# Patient Record
Sex: Male | Born: 1945 | Race: White | Hispanic: No | Marital: Married | Smoking: Never smoker
Health system: Southern US, Community
[De-identification: ages and names within clinical notes are randomized; demographics above are authoritative.]

## PROBLEM LIST (undated history)

## (undated) DIAGNOSIS — H7190 Unspecified cholesteatoma, unspecified ear: Secondary | ICD-10-CM

## (undated) DIAGNOSIS — J309 Allergic rhinitis, unspecified: Secondary | ICD-10-CM

## (undated) DIAGNOSIS — C4490 Unspecified malignant neoplasm of skin, unspecified: Secondary | ICD-10-CM

## (undated) DIAGNOSIS — R740 Nonspecific elevation of levels of transaminase and lactic acid dehydrogenase [LDH]: Secondary | ICD-10-CM

## (undated) DIAGNOSIS — L570 Actinic keratosis: Secondary | ICD-10-CM

## (undated) DIAGNOSIS — T7840XA Allergy, unspecified, initial encounter: Secondary | ICD-10-CM

## (undated) DIAGNOSIS — F419 Anxiety disorder, unspecified: Secondary | ICD-10-CM

## (undated) DIAGNOSIS — F329 Major depressive disorder, single episode, unspecified: Secondary | ICD-10-CM

## (undated) DIAGNOSIS — Z86010 Personal history of colonic polyps: Secondary | ICD-10-CM

## (undated) DIAGNOSIS — E785 Hyperlipidemia, unspecified: Secondary | ICD-10-CM

## (undated) DIAGNOSIS — R030 Elevated blood-pressure reading, without diagnosis of hypertension: Secondary | ICD-10-CM

## (undated) DIAGNOSIS — R569 Unspecified convulsions: Secondary | ICD-10-CM

## (undated) HISTORY — PX: TONSILLECTOMY: SUR1361

## (undated) HISTORY — DX: Unspecified convulsions: R56.9

## (undated) HISTORY — DX: Hyperlipidemia, unspecified: E78.5

## (undated) HISTORY — DX: Allergy, unspecified, initial encounter: T78.40XA

## (undated) HISTORY — PX: COLONOSCOPY: SHX174

## (undated) HISTORY — DX: Elevated blood-pressure reading, without diagnosis of hypertension: R03.0

## (undated) HISTORY — DX: Nonspecific elevation of levels of transaminase and lactic acid dehydrogenase (ldh): R74.0

## (undated) HISTORY — PX: APPENDECTOMY: SHX54

## (undated) HISTORY — PX: POLYPECTOMY: SHX149

## (undated) HISTORY — DX: Allergic rhinitis, unspecified: J30.9

## (undated) HISTORY — DX: Unspecified malignant neoplasm of skin, unspecified: C44.90

## (undated) HISTORY — PX: HERNIA REPAIR: SHX51

## (undated) HISTORY — PX: HEMORRHOID SURGERY: SHX153

## (undated) HISTORY — DX: Actinic keratosis: L57.0

## (undated) HISTORY — DX: Personal history of colonic polyps: Z86.010

## (undated) HISTORY — DX: Major depressive disorder, single episode, unspecified: F32.9

## (undated) HISTORY — DX: Unspecified cholesteatoma, unspecified ear: H71.90

## (undated) HISTORY — PX: LIPOMA EXCISION: SHX5283

---

## 2003-04-08 ENCOUNTER — Encounter: Payer: Self-pay | Admitting: Gastroenterology

## 2006-09-22 ENCOUNTER — Encounter: Payer: Self-pay | Admitting: Internal Medicine

## 2008-09-21 ENCOUNTER — Ambulatory Visit: Payer: Self-pay | Admitting: Internal Medicine

## 2008-09-21 DIAGNOSIS — R030 Elevated blood-pressure reading, without diagnosis of hypertension: Secondary | ICD-10-CM | POA: Insufficient documentation

## 2008-09-21 DIAGNOSIS — Z860100 Personal history of colon polyps, unspecified: Secondary | ICD-10-CM | POA: Insufficient documentation

## 2008-09-21 DIAGNOSIS — L570 Actinic keratosis: Secondary | ICD-10-CM

## 2008-09-21 DIAGNOSIS — E785 Hyperlipidemia, unspecified: Secondary | ICD-10-CM

## 2008-09-21 DIAGNOSIS — F3289 Other specified depressive episodes: Secondary | ICD-10-CM | POA: Insufficient documentation

## 2008-09-21 DIAGNOSIS — R569 Unspecified convulsions: Secondary | ICD-10-CM | POA: Insufficient documentation

## 2008-09-21 DIAGNOSIS — J309 Allergic rhinitis, unspecified: Secondary | ICD-10-CM | POA: Insufficient documentation

## 2008-09-21 DIAGNOSIS — F329 Major depressive disorder, single episode, unspecified: Secondary | ICD-10-CM

## 2008-09-21 HISTORY — DX: Hyperlipidemia, unspecified: E78.5

## 2008-09-21 HISTORY — DX: Allergic rhinitis, unspecified: J30.9

## 2008-09-21 HISTORY — DX: Actinic keratosis: L57.0

## 2008-09-21 HISTORY — DX: Personal history of colonic polyps: Z86.010

## 2008-09-21 HISTORY — DX: Elevated blood-pressure reading, without diagnosis of hypertension: R03.0

## 2008-09-21 HISTORY — DX: Other specified depressive episodes: F32.89

## 2008-09-21 HISTORY — DX: Personal history of colon polyps, unspecified: Z86.0100

## 2008-09-21 HISTORY — DX: Major depressive disorder, single episode, unspecified: F32.9

## 2008-09-21 HISTORY — DX: Unspecified convulsions: R56.9

## 2008-11-14 ENCOUNTER — Ambulatory Visit: Payer: Self-pay | Admitting: Internal Medicine

## 2008-12-28 ENCOUNTER — Ambulatory Visit: Payer: Self-pay | Admitting: Internal Medicine

## 2008-12-28 LAB — CONVERTED CEMR LAB
ALT: 35 units/L (ref 0–53)
AST: 37 units/L (ref 0–37)
Albumin: 4.1 g/dL (ref 3.5–5.2)
Alkaline Phosphatase: 73 units/L (ref 39–117)
Basophils Relative: 0 % (ref 0.0–3.0)
Blood in Urine, dipstick: NEGATIVE
CO2: 29 meq/L (ref 19–32)
Calcium: 8.8 mg/dL (ref 8.4–10.5)
Eosinophils Relative: 2.2 % (ref 0.0–5.0)
GFR calc non Af Amer: 91.22 mL/min (ref >60)
HDL: 53.1 mg/dL (ref >39.00)
Hemoglobin: 15.8 g/dL (ref 13.0–17.0)
Lymphocytes Relative: 30.3 % (ref 12.0–46.0)
MCHC: 34.6 g/dL (ref 30.0–36.0)
Monocytes Relative: 12.4 % — ABNORMAL HIGH (ref 3.0–12.0)
Neutro Abs: 2.5 10*3/uL (ref 1.4–7.7)
Nitrite: NEGATIVE
RBC: 4.83 M/uL (ref 4.22–5.81)
Sodium: 141 meq/L (ref 135–145)
Specific Gravity, Urine: 1.025
Total CHOL/HDL Ratio: 4
Total Protein: 7.1 g/dL (ref 6.0–8.3)
WBC Urine, dipstick: NEGATIVE
WBC: 4.6 10*3/uL (ref 4.5–10.5)

## 2009-01-05 ENCOUNTER — Ambulatory Visit: Payer: Self-pay | Admitting: Internal Medicine

## 2009-02-07 ENCOUNTER — Ambulatory Visit: Payer: Self-pay | Admitting: Internal Medicine

## 2009-02-07 DIAGNOSIS — H7190 Unspecified cholesteatoma, unspecified ear: Secondary | ICD-10-CM | POA: Insufficient documentation

## 2009-02-07 HISTORY — DX: Unspecified cholesteatoma, unspecified ear: H71.90

## 2009-03-09 ENCOUNTER — Ambulatory Visit: Payer: Self-pay | Admitting: Internal Medicine

## 2009-03-09 DIAGNOSIS — C4490 Unspecified malignant neoplasm of skin, unspecified: Secondary | ICD-10-CM

## 2009-03-09 HISTORY — DX: Unspecified malignant neoplasm of skin, unspecified: C44.90

## 2009-03-16 ENCOUNTER — Encounter (INDEPENDENT_AMBULATORY_CARE_PROVIDER_SITE_OTHER): Payer: Self-pay | Admitting: *Deleted

## 2009-04-04 ENCOUNTER — Telehealth: Payer: Self-pay | Admitting: Internal Medicine

## 2009-06-05 ENCOUNTER — Telehealth: Payer: Self-pay | Admitting: Internal Medicine

## 2009-06-27 ENCOUNTER — Ambulatory Visit: Payer: Self-pay | Admitting: Internal Medicine

## 2009-06-27 LAB — CONVERTED CEMR LAB
Alkaline Phosphatase: 85 units/L (ref 39–117)
Bilirubin, Direct: 0.1 mg/dL (ref 0.0–0.3)
LDL Cholesterol: 107 mg/dL — ABNORMAL HIGH (ref 0–99)
Total CHOL/HDL Ratio: 3
VLDL: 29.2 mg/dL (ref 0.0–40.0)

## 2009-07-05 ENCOUNTER — Ambulatory Visit: Payer: Self-pay | Admitting: Internal Medicine

## 2009-09-12 ENCOUNTER — Telehealth: Payer: Self-pay | Admitting: Internal Medicine

## 2010-02-23 ENCOUNTER — Ambulatory Visit: Payer: Self-pay | Admitting: Internal Medicine

## 2010-02-23 LAB — CONVERTED CEMR LAB
AST: 74 units/L — ABNORMAL HIGH (ref 0–37)
Albumin: 3.9 g/dL (ref 3.5–5.2)
Alkaline Phosphatase: 88 units/L (ref 39–117)
Basophils Absolute: 0 10*3/uL (ref 0.0–0.1)
Bilirubin Urine: NEGATIVE
Bilirubin, Direct: 0.1 mg/dL (ref 0.0–0.3)
Blood in Urine, dipstick: NEGATIVE
CO2: 30 meq/L (ref 19–32)
Calcium: 9 mg/dL (ref 8.4–10.5)
Cholesterol: 204 mg/dL — ABNORMAL HIGH (ref 0–200)
Creatinine, Ser: 1.1 mg/dL (ref 0.4–1.5)
Direct LDL: 115.2 mg/dL
Eosinophils Absolute: 0.1 10*3/uL (ref 0.0–0.7)
GFR calc non Af Amer: 73.63 mL/min (ref >60)
Glucose, Bld: 95 mg/dL (ref 70–99)
HDL: 56.6 mg/dL (ref >39.00)
Hemoglobin: 15.5 g/dL (ref 13.0–17.0)
Lymphocytes Relative: 20.9 % (ref 12.0–46.0)
MCHC: 34.3 g/dL (ref 30.0–36.0)
Monocytes Relative: 11.2 % (ref 3.0–12.0)
Neutro Abs: 4.4 10*3/uL (ref 1.4–7.7)
Neutrophils Relative %: 65.3 % (ref 43.0–77.0)
Nitrite: NEGATIVE
Platelets: 224 10*3/uL (ref 150.0–400.0)
RDW: 13.8 % (ref 11.5–14.6)
Sodium: 140 meq/L (ref 135–145)
Specific Gravity, Urine: 1.02
Total Bilirubin: 1.1 mg/dL (ref 0.3–1.2)
Triglycerides: 142 mg/dL (ref 0.0–149.0)
Urobilinogen, UA: 0.2

## 2010-03-02 ENCOUNTER — Ambulatory Visit: Payer: Self-pay | Admitting: Internal Medicine

## 2010-03-02 DIAGNOSIS — R740 Nonspecific elevation of levels of transaminase and lactic acid dehydrogenase [LDH]: Secondary | ICD-10-CM

## 2010-03-02 DIAGNOSIS — R7401 Elevation of levels of liver transaminase levels: Secondary | ICD-10-CM

## 2010-03-02 DIAGNOSIS — R7402 Elevation of levels of lactic acid dehydrogenase (LDH): Secondary | ICD-10-CM

## 2010-03-02 HISTORY — DX: Elevation of levels of lactic acid dehydrogenase (LDH): R74.02

## 2010-03-02 HISTORY — DX: Elevation of levels of liver transaminase levels: R74.01

## 2010-03-02 LAB — CONVERTED CEMR LAB
AST: 35 units/L (ref 0–37)
Albumin: 4.3 g/dL (ref 3.5–5.2)
Alkaline Phosphatase: 95 units/L (ref 39–117)
Bilirubin, Direct: 0.1 mg/dL (ref 0.0–0.3)
Indirect Bilirubin: 0.5 mg/dL (ref 0.0–0.9)
Total Bilirubin: 0.6 mg/dL (ref 0.3–1.2)

## 2010-03-05 ENCOUNTER — Telehealth: Payer: Self-pay | Admitting: Internal Medicine

## 2010-03-20 ENCOUNTER — Encounter (INDEPENDENT_AMBULATORY_CARE_PROVIDER_SITE_OTHER): Payer: Self-pay | Admitting: *Deleted

## 2010-04-03 ENCOUNTER — Encounter (INDEPENDENT_AMBULATORY_CARE_PROVIDER_SITE_OTHER): Payer: Self-pay | Admitting: *Deleted

## 2010-04-04 ENCOUNTER — Ambulatory Visit: Payer: Self-pay | Admitting: Gastroenterology

## 2010-04-16 ENCOUNTER — Ambulatory Visit: Payer: Self-pay | Admitting: Gastroenterology

## 2010-04-17 ENCOUNTER — Encounter: Payer: Self-pay | Admitting: Gastroenterology

## 2010-06-05 NOTE — Letter (Signed)
Summary: [name] Instructions  [facility]  [address] [place], [place] [address]   Phone: [phone]  Fax: [phone]       John [name]    Mar 13, 1946    MRN: [id number]        Procedure Day Dorna Bloom:  [name]  04/16/10     Arrival Time:  9:00am     Procedure Time: 10:00am     Location of Procedure:                    [name]  [facility] (4th Floor)                       PREPARATION FOR COLONOSCOPY WITH MOVIPREP   Starting 5 days prior to your procedure  [facility] 12/07  do not eat nuts, seeds, popcorn, corn, beans, peas,  salads, or any raw vegetables.  Do not take any fiber supplements (e.g. Metamucil, Citrucel, and Benefiber).  THE DAY BEFORE YOUR PROCEDURE         DATE:  SUNDAY  12/11  1.  Drink clear liquids the entire day-NO SOLID FOOD  2.  Do not drink anything colored red or purple.  Avoid juices with pulp.  No orange juice.  3.  Drink at least 64 oz. (8 glasses) of fluid/clear liquids during the day to prevent dehydration and help the prep work efficiently.  CLEAR LIQUIDS INCLUDE: Water Jello Ice Popsicles Tea (sugar ok, no milk/cream) Powdered fruit flavored drinks Coffee (sugar ok, no milk/cream) Gatorade Juice: apple, white grape, white cranberry  Lemonade Clear bullion, consomm, broth Carbonated beverages (any kind) Strained chicken noodle soup Hard Candy                             4.  In the morning, mix first dose of MoviPrep solution:    Empty 1 Pouch A and 1 Pouch B into the disposable container    Add lukewarm drinking water to the top line of the container. Mix to dissolve    Refrigerate (mixed solution should be used within 24 hrs)  5.  Begin drinking the prep at 5:00 p.m. The MoviPrep container is divided by 4 marks.   Every 15 minutes drink the solution down to the next mark (approximately 8 oz) until the full liter is complete.   6.  Follow completed prep with 16 oz of clear liquid of your choice  (Nothing red or purple).  Continue to drink clear liquids until bedtime.  7.  Before going to bed, mix second dose of MoviPrep solution:    Empty 1 Pouch A and 1 Pouch B into the disposable container    Add lukewarm drinking water to the top line of the container. Mix to dissolve    Refrigerate  THE DAY OF YOUR PROCEDURE      DATE: MONDAY  12/12  Beginning at  5:00 a.m. (5 hours before procedure):         1. Every 15 minutes, drink the solution down to the next mark (approx 8 oz) until the full liter is complete.  2. Follow completed prep with 16 oz. of clear liquid of your choice.    3. You may drink clear liquids until 8:00am  (2 HOURS BEFORE PROCEDURE).   MEDICATION INSTRUCTIONS  Unless otherwise instructed, you should take regular prescription medications with a small sip of water   as early as  possible the morning of your procedure.        OTHER INSTRUCTIONS  You will need a responsible adult at least 63 years of age to accompany you and drive you home.   This person must remain in the waiting room during your procedure.  Wear loose fitting clothing that is easily removed.  Leave jewelry and other valuables at home.  However, you may wish to bring a book to read or  an iPod/MP3 player to listen to music as you wait for your procedure to start.  Remove all body piercing jewelry and leave at home.  Total time from sign-in until discharge is approximately 2-3 hours.  You should go home directly after your procedure and rest.  You can resume normal activities the  day after your procedure.  The day of your procedure you should not:   Drive   Make legal decisions   Operate machinery   Drink alcohol   Return to work  You will receive specific instructions about eating, activities and medications before you leave.    The above instructions have been reviewed and explained to me by   Wyona Almas RN  April 04, 2010 8:24 AM     I fully understand  and can verbalize these instructions _____________________________ Date _________

## 2010-06-05 NOTE — Assessment & Plan Note (Signed)
Summary: 6 month follow up/cjr rsc bmp/njr   Vital Signs:  Patient profile:   65 year old male Height:      71 inches Weight:      165 pounds BMI:     23.10 Temp:     98.2 degrees F oral Pulse rate:   68 / minute Resp:     14 per minute BP sitting:   124 / 76  (left arm)  Vitals Entered By: Willy Eddy, LPN (July 05, 1608 9:06 AM) CC: roa labs   CC:  roa labs.  History of Present Illness:  Hyperlipidemia Follow-Up      This is a 65 year old man who presents for Hyperlipidemia follow-up.  The patient denies muscle aches, GI upset, abdominal pain, flushing, itching, constipation, diarrhea, and fatigue.  The patient denies the following symptoms: chest pain/pressure, exercise intolerance, dypsnea, palpitations, syncope, and pedal edema.  Compliance with medications (by patient report) has been near 100%.  Dietary compliance has been excellent.  The patient reports exercising 3-4X per week.  Adjunctive measures currently used by the patient include fish oil supplements.    Preventive Screening-Counseling & Management  Alcohol-Tobacco     Smoking Status: never  Problems Prior to Update: 1)  Carcinoma, Skin, Squamous Cell  (ICD-173.9) 2)  Cholesteatoma  (ICD-385.30) 3)  Physical Examination  (ICD-V70.0) 4)  Actinic Keratosis, Ear, Left  (ICD-702.0) 5)  Family History of Colon Ca 1st Degree Relative <60  (ICD-V16.0) 6)  Hx of Seizure Disorder  (ICD-780.39) 7)  Elevated Bp Reading Without Dx Hypertension  (ICD-796.2) 8)  Hyperlipidemia  (ICD-272.4) 9)  Depression  (ICD-311) 10)  Colonic Polyps, Hx of  (ICD-V12.72) 11)  Allergic Rhinitis  (ICD-477.9)  Current Problems (verified): 1)  Carcinoma, Skin, Squamous Cell  (ICD-173.9) 2)  Cholesteatoma  (ICD-385.30) 3)  Physical Examination  (ICD-V70.0) 4)  Actinic Keratosis, Ear, Left  (ICD-702.0) 5)  Family History of Colon Ca 1st Degree Relative <60  (ICD-V16.0) 6)  Hx of Seizure Disorder  (ICD-780.39) 7)  Elevated Bp  Reading Without Dx Hypertension  (ICD-796.2) 8)  Hyperlipidemia  (ICD-272.4) 9)  Depression  (ICD-311) 10)  Colonic Polyps, Hx of  (ICD-V12.72) 11)  Allergic Rhinitis  (ICD-477.9)  Medications Prior to Update: 1)  Prozac 20 Mg Caps (Fluoxetine Hcl) .Marland Kitchen.. 1 Once Daily 2)  Lipitor 40 Mg Tabs (Atorvastatin Calcium) .Marland Kitchen.. 1 Once Daily 3)  Multivitamins  Caps (Multiple Vitamin) .Marland Kitchen.. 1 Once Daily 4)  Fish Oil 1000 Mg Caps (Omega-3 Fatty Acids) .Marland Kitchen.. 1` Once Daily 5)  Bayer Aspirin Ec Low Dose 81 Mg Tbec (Aspirin) .Marland Kitchen.. 1 Once Daily 6)  Vitamin C 500 Mg Tabs (Ascorbic Acid) .Marland Kitchen.. 1 Once Daily 7)  Clonazepam 0.5 Mg Tbdp (Clonazepam) .... One By Mouth Two Times A Day Prn  Current Medications (verified): 1)  Prozac 20 Mg Caps (Fluoxetine Hcl) .Marland Kitchen.. 1 Once Daily 2)  Lipitor 40 Mg Tabs (Atorvastatin Calcium) .Marland Kitchen.. 1 Once Daily 3)  Multivitamins  Caps (Multiple Vitamin) .Marland Kitchen.. 1 Once Daily 4)  Fish Oil 1000 Mg Caps (Omega-3 Fatty Acids) .Marland Kitchen.. 1` Once Daily 5)  Bayer Aspirin Ec Low Dose 81 Mg Tbec (Aspirin) .Marland Kitchen.. 1 Once Daily 6)  Vitamin C 500 Mg Tabs (Ascorbic Acid) .Marland Kitchen.. 1 Once Daily 7)  Clonazepam 0.5 Mg Tbdp (Clonazepam) .Marland Kitchen.. 1 Q D As Needed  Allergies (verified): 1)  ! Penicillin V Potassium (Penicillin V Potassium)  Past History:  Family History: Last updated: 09/21/2008 Family History of  Colon CA 1st degree relative <60 Family History High cholesterol Family History of Stroke F 1st degree relative <60 Family History Hypertension  Social History: Last updated: 09/21/2008 Occupation:dean at [employer]  [phone]) Never Smoked   Alcohol use-yes  Drug use-no  Risk Factors: Smoking Status: never (07/05/2009)  Past medical, surgical, family and social histories (including risk factors) reviewed, and no changes noted (except as noted below).  Past Medical History: Reviewed history from 09/21/2008 and no changes required. Allergic rhinitis Colonic polyps, hx  of Depression Hyperlipidemia Hypertension Seizure disorder  IN HIGH SCHOOL TWO EPISODES/  [employer] WORK UP INCONCLUSIVE WITHOUT ANY RECURRENCE  Past Surgical History: Reviewed history from 09/21/2008 and no changes required. hernia repair 2009 appendectomy 2000 t&a 1955  Family History: Reviewed history from 09/21/2008 and no changes required. Family History of Colon CA 1st degree relative <60 Family History High cholesterol Family History of Stroke F 1st degree relative <60 Family History Hypertension  Social History: Reviewed history from 09/21/2008 and no changes required. 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  [phone]) Never Smoked   Alcohol use-yes  Drug use-no  Review of Systems  The patient denies anorexia, fever, weight loss, weight gain, vision loss, decreased hearing, hoarseness, chest pain, syncope, dyspnea on exertion, peripheral edema, prolonged cough, headaches, hemoptysis, abdominal pain, melena, hematochezia, severe indigestion/heartburn, hematuria, incontinence, genital sores, muscle weakness, suspicious skin lesions, transient blindness, difficulty walking, depression, unusual weight change, abnormal bleeding, enlarged lymph nodes, angioedema, and breast masses.    Physical Exam  General:  alert and well-hydrated.   Head:  normocephalic and atraumatic.   Eyes:  pupils equal and pupils round.   Ears:  .3 cm ulcer to the superioir aspect of the right pinna Nose:  no external deformity and no nasal discharge.   Lungs:  normal respiratory effort and no wheezes.   Heart:  normal rate and regular rhythm.   Abdomen:  soft and no splenomegaly.   Msk:  normal ROM and no crepitation.   Extremities:  No clubbing, cyanosis, edema, or deformity noted with normal full range of motion of all joints.   Neurologic:  No cranial nerve deficits noted. Station and gait are normal. Plantar reflexes are down-going bilaterally. DTRs are symmetrical throughout. Sensory, motor and  coordinative functions appear intact.   Impression & Recommendations:  Problem # 1:  HYPERLIPIDEMIA (ICD-272.4) Assessment Improved  His updated medication list for this problem includes:    Lipitor 40 Mg Tabs (Atorvastatin calcium) .Marland Kitchen... 1 once daily  Labs Reviewed: SGOT: 35 (06/27/2009)   SGPT: 35 (06/27/2009)  Prior 10 Yr Risk Heart Disease: Not enough information (09/21/2008)   HDL:62.00 (06/27/2009), 53.10 (12/28/2008)  LDL:107 (06/27/2009), 118 (12/28/2008)  Chol:198 (06/27/2009), 196 (12/28/2008)  Trig:146.0 (06/27/2009), 124.0 (12/28/2008)  Problem # 2:  DEPRESSION (ICD-311) Assessment: Unchanged  His updated medication list for this problem includes:    Prozac 20 Mg Caps (Fluoxetine hcl) .Marland Kitchen... 1 once daily    Clonazepam 0.5 Mg Tbdp (Clonazepam) .Marland Kitchen... 1 q d as needed  Discussed treatment options, including trial of antidpressant medication. Will refer to behavioral health. Follow-up call in in 24-48 hours and recheck in 2 weeks, sooner as needed. Patient agrees to call if any worsening of symptoms or thoughts of doing harm arise. Verified that the patient has no suicidal ideation at this time.   Problem # 3:  ALLERGIC RHINITIS (ICD-477.9)  Discussed use of allergy medications and environmental measures.   Complete Medication List: 1)  Prozac 20 Mg Caps (Fluoxetine  hcl) .... 1 once daily 2)  Lipitor 40 Mg Tabs (Atorvastatin calcium) .Marland Kitchen.. 1 once daily 3)  Multivitamins Caps (Multiple vitamin) .Marland Kitchen.. 1 once daily 4)  Fish Oil 1000 Mg Caps (Omega-3 fatty acids) .Marland Kitchen.. 1` once daily 5)  Bayer Aspirin Ec Low Dose 81 Mg Tbec (Aspirin) .Marland Kitchen.. 1 once daily 6)  Vitamin C 500 Mg Tabs (Ascorbic acid) .Marland Kitchen.. 1 once daily 7)  Clonazepam 0.5 Mg Tbdp (Clonazepam) .Marland Kitchen.. 1 q d as needed  Patient Instructions: 1)  Please schedule a follow-up appointment in 6 months.  CPX Prescriptions: CLONAZEPAM 0.5 MG TBDP (CLONAZEPAM) 1 q d as needed  #30 x 3   Entered by:   Willy Eddy, LPN    Authorized by:   Stacie Glaze MD   Signed by:   Willy Eddy, LPN on 13/12/6576   Method used:   Telephoned to ...       [facility] [phone]* (retail)       [address] [place], [place]  [address]       Ph: [phone]       Fax: [phone]   RxID:   [id number] PROZAC 20 MG CAPS (FLUOXETINE HCL) 1 once daily  #90 x 3   Entered by:   Willy Eddy, LPN   Authorized by:   Stacie Glaze MD   Signed by:   Willy Eddy, LPN on 32/95/1884   Method used:   Telephoned to ...       [facility] [phone]* (retail)       [address] [place], [place]  [address]       Ph: [phone]       Fax: [phone]   RxID:   [id number]

## 2010-06-05 NOTE — Progress Notes (Signed)
Summary: lab  Phone Note Call from Patient Call back at [phone]   Caller: vm Reason for Call: Lab or Test Results Summary of Call: Fri.  Paricularly t AST.   Initial call taken by: Rudy Jew, RN,  March 05, 2010 4:19 PM  Follow-up for Phone Call        liver functions were normal so isuspect it was a "proximal event" nothing serious at all Follow-up by: Stacie Glaze MD,  March 05, 2010 5:02 PM  Additional Follow-up for Phone Call Additional follow up Details #1::        pt aware Additional Follow-up by: Alfred Levins, CMA,  March 05, 2010 5:03 PM     Appended Document: Orders Update    Clinical Lists Changes  Orders: Added new Referral order of Gastroenterology Referral (GI) - Signed

## 2010-06-05 NOTE — Progress Notes (Signed)
Summary: sinus  Phone Note Call from Patient Call back at [phone]   Summary of Call: Cough for a couple days, fever of a degree, congestion, green mucus, headache, sinus pressure.  RA [place].  Allergic Pcn. Initial call taken by: Rudy Jew, RN,  Sep 12, 2009 3:16 PM  Follow-up for Phone Call        per dr Lovell Sheehan- zpack and atuss dm 2 tsp every 12 hours 6 oz Follow-up by: Willy Eddy, LPN,  Sep 12, 2009 3:57 PM  Additional Follow-up for Phone Call Additional follow up Details #1::        Phone Call Completed Additional Follow-up by: Rudy Jew, RN,  Sep 12, 2009 4:07 PM    New/Updated Medications: ATUSS DS 30-4-30 MG/5ML SUSP (PSEUDOEPHED HCL-CPM-DM HBR TAN) 2 teaspoons every 12 hours ZITHROMAX Z-PAK 250 MG TABS (AZITHROMYCIN) As directed Prescriptions: ZITHROMAX Z-PAK 250 MG TABS (AZITHROMYCIN) As directed  #1 x 0   Entered by:   Rudy Jew, RN   Authorized by:   Stacie Glaze MD   Signed by:   Rudy Jew, RN on 09/12/2009   Method used:   Electronically to        [facility]. [phone]* (retail)       [phone]       [place], [place]  [address]       Ph: [phone]       Fax: [phone]   RxID:   [id number] ATUSS DS 30-4-30 MG/5ML SUSP (PSEUDOEPHED HCL-CPM-DM HBR TAN) 2 teaspoons every 12 hours  #6 ounces x 0   Entered by:   Rudy Jew, RN   Authorized by:   Stacie Glaze MD   Signed by:   Rudy Jew, RN on 09/12/2009   Method used:   Electronically to        [facility]. [phone]* (retail)       [phone] [address]       [place], [place]  [address]       Ph: [phone]       Fax: [phone]   RxID:   [id number]

## 2010-06-05 NOTE — Assessment & Plan Note (Signed)
Summary: cpx/cjr/pt rsc from bmp/cjr-----PT RSC (BMP) // RS   Vital Signs:  Patient profile:   65 year old male Height:      71 inches Weight:      160 pounds BMI:     22.40 Temp:     98.2 degrees F oral Pulse rate:   68 / minute Resp:     14 per minute BP sitting:   126 / 80  (left arm)  Vitals Entered By: Willy Eddy, LPN (March 02, 2010 3:18 PM) CC: cpx Is Patient Diabetic? No   Primary Care Provider:  Stacie Glaze MD  CC:  cpx.  History of Present Illness: The pt was asked about all immunizations, health maint. services that are appropriate to their age and was given guidance on diet exercize  and weight management  elevated lfts seen in screening labs discussion of possible etiologies and development of plan as well as councilling I have spent greater that 30 min face to face evaluating this patient    Preventive Screening-Counseling & Management  Alcohol-Tobacco     Smoking Status: never     Tobacco Counseling: not indicated; no tobacco use  Problems Prior to Update: 1)  Transaminases, Serum, Elevated  (ICD-790.4) 2)  Carcinoma, Skin, Squamous Cell  (ICD-173.9) 3)  Cholesteatoma  (ICD-385.30) 4)  Physical Examination  (ICD-V70.0) 5)  Actinic Keratosis, Ear, Left  (ICD-702.0) 6)  Family History of Colon Ca 1st Degree Relative <60  (ICD-V16.0) 7)  Hx of Seizure Disorder  (ICD-780.39) 8)  Elevated Bp Reading Without Dx Hypertension  (ICD-796.2) 9)  Hyperlipidemia  (ICD-272.4) 10)  Depression  (ICD-311) 11)  Colonic Polyps, Hx of  (ICD-V12.72) 12)  Allergic Rhinitis  (ICD-477.9)  Current Problems (verified): 1)  Carcinoma, Skin, Squamous Cell  (ICD-173.9) 2)  Cholesteatoma  (ICD-385.30) 3)  Physical Examination  (ICD-V70.0) 4)  Actinic Keratosis, Ear, Left  (ICD-702.0) 5)  Family History of Colon Ca 1st Degree Relative <60  (ICD-V16.0) 6)  Hx of Seizure Disorder  (ICD-780.39) 7)  Elevated Bp Reading Without Dx Hypertension  (ICD-796.2) 8)   Hyperlipidemia  (ICD-272.4) 9)  Depression  (ICD-311) 10)  Colonic Polyps, Hx of  (ICD-V12.72) 11)  Allergic Rhinitis  (ICD-477.9)  Medications Prior to Update: 1)  Prozac 20 Mg Caps (Fluoxetine Hcl) .Marland Kitchen.. 1 Once Daily 2)  Lipitor 40 Mg Tabs (Atorvastatin Calcium) .Marland Kitchen.. 1 Once Daily 3)  Multivitamins  Caps (Multiple Vitamin) .Marland Kitchen.. 1 Once Daily 4)  Fish Oil 1000 Mg Caps (Omega-3 Fatty Acids) .Marland Kitchen.. 1` Once Daily 5)  Bayer Aspirin Ec Low Dose 81 Mg Tbec (Aspirin) .Marland Kitchen.. 1 Once Daily 6)  Vitamin C 500 Mg Tabs (Ascorbic Acid) .Marland Kitchen.. 1 Once Daily 7)  Clonazepam 0.5 Mg Tbdp (Clonazepam) .Marland Kitchen.. 1 Q D As Needed 8)  Atuss Ds 30-4-30 Mg/70ml Susp (Pseudoephed Hcl-Cpm-Dm Hbr Tan) .... 2 Teaspoons Every 12 Hours 9)  Zithromax Z-Pak 250 Mg Tabs (Azithromycin) .... As Directed  Current Medications (verified): 1)  Prozac 20 Mg Caps (Fluoxetine Hcl) .Marland Kitchen.. 1 Once Daily 2)  Lipitor 40 Mg Tabs (Atorvastatin Calcium) .Marland Kitchen.. 1 Once Daily 3)  Multivitamins  Caps (Multiple Vitamin) .Marland Kitchen.. 1 Once Daily 4)  Fish Oil 1000 Mg Caps (Omega-3 Fatty Acids) .Marland Kitchen.. 1` Once Daily 5)  Bayer Aspirin Ec Low Dose 81 Mg Tbec (Aspirin) .Marland Kitchen.. 1 Once Daily 6)  Vitamin C 500 Mg Tabs (Ascorbic Acid) .Marland Kitchen.. 1 Once Daily 7)  Clonazepam 0.5 Mg Tbdp (Clonazepam) .Marland Kitchen.. 1 Q D As Needed  Allergies (verified): 1)  ! Penicillin V Potassium (Penicillin V Potassium)  Past History:  Family History: Last updated: 09/21/2008 Family History of Colon CA 1st degree relative <60 Family History High cholesterol Family History of Stroke F 1st degree relative <60 Family History Hypertension  Social History: Last updated: 09/21/2008 Occupation:dean at [employer]  [phone]) Never Smoked   Alcohol use-yes  Drug use-no  Risk Factors: Smoking Status: never (03/02/2010)  Past medical, surgical, family and social histories (including risk factors) reviewed, and no changes noted (except as noted below).  Past Medical History: Reviewed history from  09/21/2008 and no changes required. Allergic rhinitis Colonic polyps, hx of Depression Hyperlipidemia Hypertension Seizure disorder  IN HIGH SCHOOL TWO EPISODES/  [employer] WORK UP INCONCLUSIVE WITHOUT ANY RECURRENCE  Past Surgical History: Reviewed history from 09/21/2008 and no changes required. hernia repair 2009 appendectomy 2000 t&a 1955  Family History: Reviewed history from 09/21/2008 and no changes required. Family History of Colon CA 1st degree relative <60 Family History High cholesterol Family History of Stroke F 1st degree relative <60 Family History Hypertension  Social History: Reviewed history from 09/21/2008 and no changes required. 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  [phone]) Never Smoked   Alcohol use-yes  Drug use-no  Review of Systems       Flu Vaccine Consent Questions     Do you have a history of severe allergic reactions to this vaccine? no    Any prior history of allergic reactions to egg and/or gelatin? no    Do you have a sensitivity to the preservative Thimersol? no    Do you have a past history of Guillan-Barre Syndrome? no    Do you currently have an acute febrile illness? no    Have you ever had a severe reaction to latex? no    Vaccine information given and explained to patient? yes    Are you currently pregnant? no    Lot Number:[id number]   Exp Date:11/03/2010   Site Given  Left Deltoid IM   Physical Exam  General:  alert and well-hydrated.   Head:  normocephalic and atraumatic.   Eyes:  pupils equal and pupils round.   Ears:  R ear normal and L ear normal.   Nose:  no external deformity and no nasal discharge.   Chest Wall:  No deformities, masses, tenderness or gynecomastia noted. Lungs:  normal respiratory effort and no wheezes.   Heart:  normal rate and regular rhythm.   Abdomen:  soft and no splenomegaly.   Rectal:  no external abnormalities and no hemorrhoids.   Genitalia:  circumcised and no urethral discharge.     Prostate:  no gland enlargement and no nodules.   Msk:  normal ROM and no crepitation.   Pulses:  R and L carotid,radial,femoral,dorsalis pedis and posterior tibial pulses are full and equal bilaterally Extremities:  No clubbing, cyanosis, edema, or deformity noted with normal full range of motion of all joints.   Neurologic:  No cranial nerve deficits noted. Station and gait are normal. Plantar reflexes are down-going bilaterally. DTRs are symmetrical throughout. Sensory, motor and coordinative functions appear intact.   Impression & Recommendations:  Problem # 1:  TRANSAMINASES, SERUM, ELEVATED (ICD-790.4) Assessment New revieww all couses with pt ands agreed to recheck to see if transiet elevation may have been due to etoh Orders: Venipuncture (97673) TLB-Hepatic/Liver Function Pnl (80076-HEPATIC) Specimen Handling (41937)  Problem # 2:  PHYSICAL EXAMINATION (ICD-V70.0) The pt was asked about all immunizations, health maint.  services that are appropriate to their age and was given guidance on diet exercize  and weight management  Colonoscopy: normal (11/03/2006) Td Booster: Tdap (01/05/2009)   Flu Vax: Fluvax 3+ (03/02/2010)   Pneumovax: Pneumovax (03/02/2010) Chol: 204 (02/23/2010)   HDL: 56.60 (02/23/2010)   LDL: 107 (06/27/2009)   TG: 142.0 (02/23/2010) TSH: 2.23 (02/23/2010)   PSA: 0.53 (02/23/2010) Next Colonoscopy due:: 11/2009 (02/07/2009)  Discussed using sunscreen, use of alcohol, drug use, self testicular exam, routine dental care, routine eye care, routine physical exam, seat belts, multiple vitamins, osteoporosis prevention, adequate calcium intake in diet, and recommendations for immunizations.  Discussed exercise and checking cholesterol.  Discussed gun safety, safe sex, and contraception. Also recommend checking PSA.  Complete Medication List: 1)  Prozac 20 Mg Caps (Fluoxetine hcl) .Marland Kitchen.. 1 once daily 2)  Lipitor 40 Mg Tabs (Atorvastatin calcium) .Marland Kitchen.. 1 once daily 3)   Multivitamins Caps (Multiple vitamin) .Marland Kitchen.. 1 once daily 4)  Fish Oil 1000 Mg Caps (Omega-3 fatty acids) .Marland Kitchen.. 1` once daily 5)  Bayer Aspirin Ec Low Dose 81 Mg Tbec (Aspirin) .Marland Kitchen.. 1 once daily 6)  Vitamin C 500 Mg Tabs (Ascorbic acid) .Marland Kitchen.. 1 once daily 7)  Clonazepam 0.5 Mg Tbdp (Clonazepam) .Marland Kitchen.. 1 q d as needed  Other Orders: Admin 1st Vaccine (81191) Flu Vaccine 50yrs + (47829) Pneumococcal Vaccine (56213) Admin of Any Addtl Vaccine (08657)  Patient Instructions: 1)  Please schedule a follow-up appointment in 6 months. 2)  Hepatic Panel prior to visit, ICD-9:995.20 3)  Lipid Panel prior to visit, ICD-9:272.4 Prescriptions: CLONAZEPAM 0.5 MG TBDP (CLONAZEPAM) 1 q d as needed  #90 x 1   Entered by:   Willy Eddy, LPN   Authorized by:   Stacie Glaze MD   Signed by:   Willy Eddy, LPN on 84/69/6295   Method used:   Print then Give to Patient   RxID:   [id number] LIPITOR 40 MG TABS (ATORVASTATIN CALCIUM) 1 once daily  #90 x 3   Entered by:   Willy Eddy, LPN   Authorized by:   Stacie Glaze MD   Signed by:   Willy Eddy, LPN on 66/44/0347   Method used:   Print then Give to Patient   RxID:   [id number] PROZAC 20 MG CAPS (FLUOXETINE HCL) 1 once daily  #90 x 3   Entered by:   Willy Eddy, LPN   Authorized by:   Stacie Glaze MD   Signed by:   Willy Eddy, LPN on 51/88/4166   Method used:   Print then Give to Patient   RxID:   [id number]    Orders Added: 1)  Admin 1st Vaccine [90471] 2)  Flu Vaccine 12yrs + [32202] 3)  Venipuncture [54270] 4)  TLB-Hepatic/Liver Function Pnl [80076-HEPATIC] 5)  Specimen Handling [99000] 6)  Pneumococcal Vaccine [62376] 7)  Admin of Any Addtl Vaccine [90472] 8)  Est. Patient 40-64 years [99396] 9)  Est. Patient Level III [28315]   Immunizations Administered:  Pneumonia Vaccine:    Vaccine Type: Pneumovax    Site: right deltoid    Mfr: Merck    Dose: 0.5 ml    Route: IM    Given  by: Willy Eddy, LPN    Exp. Date: 08/30/2011    Lot #: [id number]   Immunizations Administered:  Pneumonia Vaccine:    Vaccine Type: Pneumovax    Site: right deltoid    Mfr: Merck    Dose:  0.5 ml    Route: IM    Given by: Willy Eddy, LPN    Exp. Date: 08/30/2011    Lot #: [id number]

## 2010-06-05 NOTE — Miscellaneous (Signed)
Summary: [facility] Previsit/prep  Clinical Lists Changes  Medications: Added new medication of MOVIPREP 100 GM  SOLR (PEG-KCL-NACL-NASULF-NA ASC-C) As per prep instructions. - Signed Rx of MOVIPREP 100 GM  SOLR (PEG-KCL-NACL-NASULF-NA ASC-C) As per prep instructions.;  #1 x 0;  Signed;  Entered by: Wyona Almas RN;  Authorized by: Meryl Dare MD [facility];  Method used: Electronically to [facility] [id number]*, [address] [place], [place], [place]  [address], Ph: [phone], Fax: [phone] Allergies: Changed allergy or adverse reaction from PENICILLIN V POTASSIUM (PENICILLIN V POTASSIUM) to PENICILLIN V POTASSIUM (PENICILLIN V POTASSIUM) Observations: Added new observation of ALLERGY REV: Done (04/04/2010 7:58)    Prescriptions: MOVIPREP 100 GM  SOLR (PEG-KCL-NACL-NASULF-NA ASC-C) As per prep instructions.  #1 x 0   Entered by:   Wyona Almas RN   Authorized by:   Meryl Dare MD [facility]   Signed by:   Wyona Almas RN on 04/04/2010   Method used:   Electronically to        [facility] [id number]* (retail)       [address] [place], [place]  [address]       Ph: [phone]       Fax: [phone]   RxID:   [id number]

## 2010-06-05 NOTE — Letter (Signed)
Summary: Records from [facility] 2002 - 2008  Records from [facility] 2002 - 2008   Imported By: Maryln Gottron 10/19/2009 12:25:36  _____________________________________________________________________  External Attachment:    Type:   Image     Comment:   External Document

## 2010-06-05 NOTE — Progress Notes (Signed)
Summary: Pt req refill of generic Prozac 20mg  to [facility]  Phone Note Call from Patient Call back at [phone] cell   Caller: Patient Summary of Call: Pt called to req refill of generic Prozac 20mg .   This med was prescribed by pts previous doctor. Please call in to [facility] on [address].     Initial call taken by: Lucy Antigua,  June 05, 2009 8:13 AM    Prescriptions: PROZAC 20 MG CAPS (FLUOXETINE HCL) 1 once daily  #30 x 6   Entered by:   Willy Eddy, LPN   Authorized by:   Stacie Glaze MD   Signed by:   Willy Eddy, LPN on 24/40/1027   Method used:   Electronically to        [facility] [phone]* (retail)       [address] [place], [place]  [address]       Ph: [phone]       Fax: [phone]   RxID:   [id number] PROZAC 20 MG CAPS (FLUOXETINE HCL) 1 once daily  #30 x 6   Entered by:   Willy Eddy, LPN   Authorized by:   Stacie Glaze MD   Signed by:   Willy Eddy, LPN on 93/23/5573   Method used:   Print then Give to Patient   RxID:   [id number]

## 2010-06-05 NOTE — Letter (Signed)
Summary: Pre Visit Letter Revised  [facility]  [address] [place], [place] [address]   Phone: [phone]  Fax: [phone]        03/20/2010 MRN: [id number]   John [name] [address] [place], [place]  [address]             Procedure Date:  April 16, 2010   Welcome to the Gastroenterology Division at [facility].    You are scheduled to see a nurse for your pre-procedure visit on NOVEMBER 30, 2011at 8:00 A.M. on the 3rd floor at [facility], [address]  We ask that you try to arrive at our office 15 minutes prior to your appointment time to allow for check-in.  Please take a minute to review the attached form.  If you answer "Yes" to one or more of the questions on the first page, we ask that you call the person listed at your earliest opportunity.  If you answer "No" to all of the questions, please complete the rest of the form and bring it to your appointment.    Your nurse visit will consist of discussing your medical and surgical history, your immediate family medical history, and your medications.   If you are unable to list all of your medications on the form, please bring the medication bottles to your appointment and we will list them.  We will need to be aware of both prescribed and over the counter drugs.  We will need to know exact dosage information as well.    Please be prepared to read and sign documents such as consent forms, a financial agreement, and acknowledgement forms.  If necessary, and with your consent, a friend or relative is welcome to sit-in on the nurse visit with you.  Please bring your insurance card so that we may make a copy of it.  If your insurance requires a referral to see a specialist, please bring your referral form from your primary care physician.  No co-pay is required for this nurse visit.     If you cannot keep your appointment, please call [phone] to cancel or reschedule prior to your  appointment date.  This allows [place] the opportunity to schedule an appointment for another patient in need of care.    Thank you for choosing [facility] for your medical needs.  We appreciate the opportunity to care for you.  Please visit [place] at our website  to learn more about our practice.  Sincerely, The Gastroenterology Division

## 2010-06-07 NOTE — Letter (Signed)
Summary: Patient Notice- Polyp Results  [facility]  [address] [place], [place] [address]   Phone: [phone]  Fax: [phone]        April 17, 2010 MRN: [id number]    John [name] [address] [place], [place]  [address]    Dear [name],  I am pleased to inform you that the colon polyp(s) removed during your recent colonoscopy was (were) found to be benign (no cancer detected) upon pathologic examination.  I recommend you have a repeat colonoscopy examination in 5 years to look for recurrent polyps, as having colon polyps increases your risk for having recurrent polyps or even colon cancer in the future.  Should you develop new or worsening symptoms of abdominal pain, bowel habit changes or bleeding from the rectum or bowels, please schedule an evaluation with either your primary care physician or with me.  Continue treatment plan as outlined the day of your exam.  Please call us if you are having persistent problems or have questions about your condition that have not been fully answered at this time.  Sincerely,  Meryl Dare MD [facility]  This letter has been electronically signed by your physician.  Appended Document: Patient Notice- Polyp Results Letter Mailed

## 2010-06-07 NOTE — Procedures (Signed)
Summary: Colonoscopy  Patient: [name] Note: All result statuses are Final unless otherwise noted.  Tests: (1) Colonoscopy (COL)   COL Colonoscopy           DONE     [facility]     [address]     [place], [place]  [address]           COLONOSCOPY PROCEDURE REPORT     PATIENT:  John [name], John [name]  MR#:  [id number]     BIRTHDATE:  [birth year], 64 yrs. old  GENDER:  male     ENDOSCOPIST:  Judie Petit T. Russella Dar, MD, [facility]     Referred by:  Stacie Glaze, M.D.     PROCEDURE DATE:  04/16/2010     PROCEDURE:  Colonoscopy with snare polypectomy     ASA CLASS:  Class II     INDICATIONS:  1) surveillance and high-risk screening  2) history     of adenomatous colon polyps     MEDICATIONS:   Fentanyl 100 mcg IV, Versed 10 mg IV     DESCRIPTION OF PROCEDURE:   After the risks benefits and     alternatives of the procedure were thoroughly explained, informed     consent was obtained.  Digital rectal exam was performed and     revealed no abnormalities.   The LB [id number] endoscope     was introduced through the anus and advanced to the cecum, which     was identified by both the appendix and ileocecal valve, without     limitations.  The quality of the prep was excellent, using     MoviPrep.  The instrument was then slowly withdrawn as the colon     was fully examined.     <<PROCEDUREIMAGES>>     FINDINGS:  Two polyps were found in the descending colon. They     were 4 - 5 mm in size. Polyps were snared without cautery.     Retrieval was successful. A normal appearing cecum, ileocecal     valve, and appendiceal orifice were identified. The ascending,     hepatic flexure, transverse, splenic flexure, sigmoid colon, and     rectum appeared unremarkable.  Retroflexed views in the rectum     revealed no abnormalities.  The time to cecum =  3.5  minutes. The     scope was then withdrawn (time =  12.5  min) from the patient and     the procedure completed.        COMPLICATIONS:  None           ENDOSCOPIC IMPRESSION:     1) 4 - 5 mm, two polyps in the descending colon           RECOMMENDATIONS:     1) Await pathology results     2) Repeat Colonoscopy in 5 years pending pathology review           Malcolm T. Russella Dar, MD, [facility]           n.     eSIGNED:   Venita Lick. Stark at 04/16/2010 09:57 AM           Victorino Sparrow, [id number]  Note: An exclamation mark (!) indicates a result that was not dispersed into the flowsheet. Document Creation Date: 04/16/2010 9:57 AM _______________________________________________________________________  (1) Order result status: Final Collection or observation date-time: 04/16/2010 09:51 Requested date-time:  Receipt date-time:  Reported date-time:  Referring Physician:  Ordering Physician: Claudette Head [phone]) Specimen Source:  Source: Launa Grill Order Number: [id number] Lab site:   Appended Document: Colonoscopy     Appended Document: Colonoscopy     Procedures Next Due Date:    Colonoscopy: 04/2015

## 2010-06-07 NOTE — Procedures (Signed)
Summary: [facility]  [facility]   Imported By: Lester Ogdensburg 04/16/2010 08:48:02  _____________________________________________________________________  External Attachment:    Type:   Image     Comment:   External Document

## 2010-06-12 ENCOUNTER — Telehealth: Payer: Self-pay | Admitting: *Deleted

## 2010-06-12 NOTE — Telephone Encounter (Signed)
Is having a sore throat and leaving for [place] in 2 days........has green mucus, but only wants an antibiotic if bacterial.  No fever.

## 2010-06-13 ENCOUNTER — Ambulatory Visit (INDEPENDENT_AMBULATORY_CARE_PROVIDER_SITE_OTHER): Payer: BC Managed Care – PPO | Admitting: Internal Medicine

## 2010-06-13 ENCOUNTER — Encounter: Payer: Self-pay | Admitting: Internal Medicine

## 2010-06-13 VITALS — BP 124/80 | HR 68 | Temp 98.6°F | Resp 14 | Ht 71.0 in | Wt 160.0 lb

## 2010-06-13 DIAGNOSIS — E785 Hyperlipidemia, unspecified: Secondary | ICD-10-CM

## 2010-06-13 DIAGNOSIS — J019 Acute sinusitis, unspecified: Secondary | ICD-10-CM

## 2010-06-13 MED ORDER — AZITHROMYCIN 250 MG PO TABS: 250.0000 mg | ORAL_TABLET | Freq: Every day | ORAL | Status: AC

## 2010-06-13 MED ORDER — PHENYLEPHRINE-APAP-GUAIFENESIN 10-650-400 MG/20ML PO LIQD: 5.0000 mL | Freq: Four times a day (QID) | ORAL | Status: DC | PRN

## 2010-06-13 NOTE — Telephone Encounter (Signed)
Talked with pt and he ov at 12 noon

## 2010-06-13 NOTE — Progress Notes (Signed)
  Subjective:    Patient ID: John [name], male    DOB: 05-14-1945, 65 y.o.   MRN: [id number]  Sinusitis This is a new problem. The current episode started in the past 7 days. The problem has been gradually worsening since onset. There has been no fever. His pain is at a severity of 2/10. Associated symptoms include chills, congestion, coughing, headaches and sinus pressure. Past treatments include oral decongestants. The treatment provided mild relief.      Review of Systems  Constitutional: Positive for chills.  HENT: Positive for congestion and sinus pressure.   Respiratory: Positive for cough.   Neurological: Positive for headaches.       Objective:   Physical Exam  Constitutional: He appears well-developed and well-nourished.  HENT:  Head: Normocephalic and atraumatic.        Nasal passages showed swollen turbinates with crusty discharge oropharynx showed erythema and posterior cobblestoning neck was supple without adenopathy  Eyes: Conjunctivae are normal. Pupils are equal, round, and reactive to light.  Neck: Normal range of motion. Neck supple.  Cardiovascular: Normal rate and regular rhythm.   Pulmonary/Chest: Effort normal and breath sounds normal.  Abdominal: Soft. Bowel sounds are normal.  Musculoskeletal: Normal range of motion.  Skin: Skin is warm and dry.          Assessment & Plan:  1. Acute sinusitis the patient has a history of allergic reaction to penicillin therefore azithromycin will be chosen for his infection he is also instructed to get around Mucinex fast max cough and cold use it 4 times a day and use saline lavage 4 times a day.  He has a flight plan and was instructed if he develops ear pain prior to his flight he should contact our office and not fly he should use a saline nasal spray prior to flight to irrigate sinus passages for safety.  Should his symptoms worsen he is instructed to contact us for . Referral to their nose and throat

## 2010-06-13 NOTE — Patient Instructions (Signed)
Obtain a saline nasal spray and irrigate the sinuses 3-4 times a day as needed especially use this prior to your flight

## 2010-06-13 NOTE — Telephone Encounter (Signed)
Add on this AM at end of clinic

## 2010-07-24 ENCOUNTER — Telehealth: Payer: Self-pay | Admitting: Internal Medicine

## 2010-07-24 NOTE — Telephone Encounter (Signed)
Pt called and has had diarrhrea, fever,bloating, headache, for approx 1 week. Pt is req med called in to [facility]

## 2010-07-24 NOTE — Telephone Encounter (Signed)
Left message on machine for pt about virus going around- unable to contact him on cell phone--no dial tone--gave insturctions if virus but if continues over 1 week to 2 weeks give [place] a call back-stay hydrated

## 2010-07-25 ENCOUNTER — Other Ambulatory Visit: Payer: Self-pay | Admitting: Internal Medicine

## 2010-09-24 ENCOUNTER — Other Ambulatory Visit: Payer: Self-pay | Admitting: Internal Medicine

## 2010-09-24 ENCOUNTER — Other Ambulatory Visit (INDEPENDENT_AMBULATORY_CARE_PROVIDER_SITE_OTHER): Payer: BC Managed Care – PPO | Admitting: Internal Medicine

## 2010-09-24 ENCOUNTER — Other Ambulatory Visit (INDEPENDENT_AMBULATORY_CARE_PROVIDER_SITE_OTHER): Payer: BC Managed Care – PPO

## 2010-09-24 DIAGNOSIS — E785 Hyperlipidemia, unspecified: Secondary | ICD-10-CM

## 2010-09-24 DIAGNOSIS — T887XXA Unspecified adverse effect of drug or medicament, initial encounter: Secondary | ICD-10-CM

## 2010-09-24 DIAGNOSIS — Z13220 Encounter for screening for lipoid disorders: Secondary | ICD-10-CM

## 2010-09-24 LAB — HEPATIC FUNCTION PANEL
ALT: 39 U/L (ref 0–53)
Albumin: 3.7 g/dL (ref 3.5–5.2)
Bilirubin, Direct: 0.1 mg/dL (ref 0.0–0.3)
Total Protein: 6.1 g/dL (ref 6.0–8.3)

## 2010-09-24 LAB — LDL CHOLESTEROL, DIRECT: Direct LDL: 107.2 mg/dL

## 2010-09-24 LAB — LIPID PANEL: Triglycerides: 336 mg/dL — ABNORMAL HIGH (ref 0.0–149.0)

## 2010-10-04 ENCOUNTER — Ambulatory Visit (INDEPENDENT_AMBULATORY_CARE_PROVIDER_SITE_OTHER): Payer: BC Managed Care – PPO | Admitting: Internal Medicine

## 2010-10-04 ENCOUNTER — Encounter: Payer: Self-pay | Admitting: Internal Medicine

## 2010-10-04 VITALS — BP 132/80 | HR 68 | Temp 98.0°F | Resp 14 | Ht 71.0 in | Wt 163.0 lb

## 2010-10-04 DIAGNOSIS — R7401 Elevation of levels of liver transaminase levels: Secondary | ICD-10-CM

## 2010-10-04 DIAGNOSIS — E785 Hyperlipidemia, unspecified: Secondary | ICD-10-CM

## 2010-10-04 DIAGNOSIS — F339 Major depressive disorder, recurrent, unspecified: Secondary | ICD-10-CM

## 2010-10-04 MED ORDER — DULOXETINE HCL 60 MG PO CPEP: 60.0000 mg | ORAL_CAPSULE | Freq: Every day | ORAL | Status: DC

## 2010-10-04 NOTE — Progress Notes (Signed)
  Subjective:    Patient ID: John [name], male    DOB: 1945/05/27, 65 y.o.   MRN: [id number]  HPI patient presents for followup of hyperlipidemia and we reviewed his lab work showing fairly stable cholesterol but a markedly elevated triglyceride a slight elevation of his liver functions this causes there is a discussion about use of alcohol he admits that he is using alcohol frequently and we discussed whether or not this was due to an addiction or do to a desire to treat symptoms.  We concluded our conversation that he was treating depression by increasing alcohol he's been on Prozac for many years on 20 mg without any titration up or down I suspect that he is experiencing situational depression due to work and home stresses.    Review of Systems  Constitutional: Negative for fever and fatigue.  HENT: Negative for hearing loss, congestion, neck pain and postnasal drip.   Eyes: Negative for discharge, redness and visual disturbance.  Respiratory: Negative for cough, shortness of breath and wheezing.   Cardiovascular: Negative for leg swelling.  Gastrointestinal: Negative for abdominal pain, constipation and abdominal distention.  Genitourinary: Negative for urgency and frequency.  Musculoskeletal: Negative for joint swelling and arthralgias.  Skin: Negative for color change and rash.  Neurological: Negative for weakness and light-headedness.  Hematological: Negative for adenopathy.  Psychiatric/Behavioral: Positive for dysphoric mood. Negative for behavioral problems.       Past Medical History  Diagnosis Date  . ACTINIC KERATOSIS, EAR, LEFT 09/21/2008  . ALLERGIC RHINITIS 09/21/2008  . 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 CHOLESTEATOMA 02/07/2009  . COLONIC POLYPS, HX OF 09/21/2008  . DEPRESSION 09/21/2008  . ELEVATED BP READING WITHOUT DX HYPERTENSION 09/21/2008  . HYPERLIPIDEMIA 09/21/2008  . SEIZURE DISORDER 09/21/2008  . TRANSAMINASES, SERUM, ELEVATED 03/02/2010   Past  Surgical History  Procedure Date  . Hernia repair   . Appendectomy   . Tonsillectomy     reports that he has never smoked. He does not have any smokeless tobacco history on file. He reports that he drinks alcohol. He reports that he does not use illicit drugs. family history includes Cancer in an unspecified family member; Colon cancer in an unspecified family member; Hyperlipidemia in an unspecified family member; Hypertension in an unspecified family member; and Stroke in an unspecified family member. Allergies  Allergen Reactions  . Penicillins     REACTION: rash    Objective:   Physical Exam  Constitutional: He appears well-developed and well-nourished.  HENT:  Head: Normocephalic and atraumatic.  Eyes: Conjunctivae are normal. Pupils are equal, round, and reactive to light.  Neck: Normal range of motion. Neck supple.  Cardiovascular: Normal rate and regular rhythm.   Pulmonary/Chest: Effort normal and breath sounds normal.  Abdominal: Soft. Bowel sounds are normal.          Assessment & Plan:  I believe that the best intervention for the control of his cholesterol his dietary modification with reduction of alcohol this should result in improvement of his liver functions as well as decreased triglyceride levels.  Since the underlying etiology of alcohol use his depression we will change his Prozac from 20 mg by mouth daily 2 Cymbalta 60 mg by mouth daily and monitor the effect of this drug  I have spent more than 30 minutes examining this patient face-to-face of which over half was spent in counseling

## 2010-10-24 ENCOUNTER — Encounter: Payer: Self-pay | Admitting: Internal Medicine

## 2010-10-24 ENCOUNTER — Ambulatory Visit (INDEPENDENT_AMBULATORY_CARE_PROVIDER_SITE_OTHER): Payer: BC Managed Care – PPO | Admitting: Internal Medicine

## 2010-10-24 VITALS — BP 130/80 | HR 72 | Temp 98.0°F | Resp 16 | Ht 71.0 in | Wt 163.0 lb

## 2010-10-24 DIAGNOSIS — Z2911 Encounter for prophylactic immunotherapy for respiratory syncytial virus (RSV): Secondary | ICD-10-CM

## 2010-10-24 DIAGNOSIS — T887XXA Unspecified adverse effect of drug or medicament, initial encounter: Secondary | ICD-10-CM

## 2010-10-24 DIAGNOSIS — Z0000 Encounter for general adult medical examination without abnormal findings: Secondary | ICD-10-CM

## 2010-10-24 MED ORDER — BUPROPION HCL ER (XL) 150 MG PO TB24: 150.0000 mg | ORAL_TABLET | Freq: Every day | ORAL | Status: DC

## 2010-10-25 NOTE — Progress Notes (Signed)
  Subjective:    Patient ID: John [name], male    DOB: 06/14/1945, 65 y.o.   MRN: [id number]  HPI  Patient is seen in followup of medication change.  He was changed from Prozac 22 Cymbalta 60.  His past medical history of depression and obsessive-compulsive disorder states that the change from Prozac to Cymbalta has elevated his mood but worsened his obsessive-compulsive disorder and resulted in increased sexual dysfunction.  He we discussed the etiology of his depression and OCD disorder and therapy has received in the past.  We spent 30 minutes counseling the patient about medications and associated symptoms of depression with obsessive-compulsive disorder  Review of Systems  Constitutional: Negative for fever and fatigue.  HENT: Negative for hearing loss, congestion, neck pain and postnasal drip.   Eyes: Negative for discharge, redness and visual disturbance.  Respiratory: Negative for cough, shortness of breath and wheezing.   Cardiovascular: Negative for leg swelling.  Gastrointestinal: Negative for abdominal pain, constipation and abdominal distention.  Genitourinary: Negative for urgency and frequency.  Musculoskeletal: Negative for joint swelling and arthralgias.  Skin: Negative for color change and rash.  Neurological: Negative for weakness and light-headedness.  Hematological: Negative for adenopathy.  Psychiatric/Behavioral: Negative for behavioral problems.   Past Medical History  Diagnosis Date  . ACTINIC KERATOSIS, EAR, LEFT 09/21/2008  . ALLERGIC RHINITIS 09/21/2008  . 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 CHOLESTEATOMA 02/07/2009  . COLONIC POLYPS, HX OF 09/21/2008  . DEPRESSION 09/21/2008  . ELEVATED BP READING WITHOUT DX HYPERTENSION 09/21/2008  . HYPERLIPIDEMIA 09/21/2008  . SEIZURE DISORDER 09/21/2008  . TRANSAMINASES, SERUM, ELEVATED 03/02/2010   Past Surgical History  Procedure Date  . Hernia repair   . Appendectomy   . Tonsillectomy     reports  that he has never smoked. He does not have any smokeless tobacco history on file. He reports that he drinks alcohol. He reports that he does not use illicit drugs. family history includes Colon cancer in an unspecified family member; Hyperlipidemia in his mother; Hypertension in his mother; and Stroke in his mother. Allergies  Allergen Reactions  . Penicillins     REACTION: rash       Objective:   Physical Exam  Nursing note and vitals reviewed. Constitutional: He appears well-developed and well-nourished.  HENT:  Head: Normocephalic and atraumatic.  Eyes: Conjunctivae are normal. Pupils are equal, round, and reactive to light.  Neck: Normal range of motion. Neck supple.  Cardiovascular: Normal rate and regular rhythm.   Pulmonary/Chest: Effort normal and breath sounds normal.  Abdominal: Soft. Bowel sounds are normal.          Assessment & Plan:  Sexual dysfunction may be treated by adding Wellbutrin to his antidepressant regimen and change him back to Prozac 20.  The final combination will be Wellbutrin extended release 150 mg and Prozac 20 mg by mouth daily the combination should have less sexual side effects as well as increased effectiveness for his depression and obsessive-compulsive repetitive behaviors.  Prescriptions were given for Wellbutrin the patient has a prescription for Prozac.  We established criteria for him to call back if they were not met within 2-3 weeks and he has a followup in one month

## 2010-11-04 ENCOUNTER — Other Ambulatory Visit: Payer: Self-pay | Admitting: Internal Medicine

## 2010-11-28 ENCOUNTER — Encounter: Payer: Self-pay | Admitting: Internal Medicine

## 2010-11-28 ENCOUNTER — Ambulatory Visit (INDEPENDENT_AMBULATORY_CARE_PROVIDER_SITE_OTHER): Payer: BC Managed Care – PPO | Admitting: Internal Medicine

## 2010-11-28 VITALS — BP 120/70 | HR 72 | Temp 98.2°F | Resp 16 | Ht 71.0 in | Wt 155.0 lb

## 2010-11-28 DIAGNOSIS — D239 Other benign neoplasm of skin, unspecified: Secondary | ICD-10-CM

## 2010-11-28 DIAGNOSIS — D229 Melanocytic nevi, unspecified: Secondary | ICD-10-CM

## 2010-11-28 NOTE — Progress Notes (Signed)
  Subjective:    Patient ID: John [name], male    DOB: August 18, 1945, 65 y.o.   MRN: [id number]  HPI Patient presents for removal of 2 mol identified on physical examination his potentially neoplastic   Review of Systems  Constitutional: Negative for fever and fatigue.  HENT: Negative for hearing loss, congestion, neck pain and postnasal drip.   Eyes: Negative for discharge, redness and visual disturbance.  Respiratory: Negative for cough, shortness of breath and wheezing.   Cardiovascular: Negative for leg swelling.  Gastrointestinal: Negative for abdominal pain, constipation and abdominal distention.  Genitourinary: Negative for urgency and frequency.  Musculoskeletal: Negative for joint swelling and arthralgias.  Skin: Negative for color change and rash.  Neurological: Negative for weakness and light-headedness.  Hematological: Negative for adenopathy.  Psychiatric/Behavioral: Negative for behavioral problems.       Objective:   Physical Exam  Skin:       Suspicious lesions on his back and mid lower back removed by shave biopsy          Assessment & Plan:  Informed consent obtained anesthesia by subcutaneous injection of 2% lidocaine with epi using a 15 blade a shave biopsy of 2 lesions on the mid back and mid lower back were taken and sent for pathological evaluation patient was given care instructions to the sites Band-Aids were placed and the patient will be called in one week's time with the results of the pathology

## 2011-02-04 ENCOUNTER — Ambulatory Visit (INDEPENDENT_AMBULATORY_CARE_PROVIDER_SITE_OTHER): Payer: BC Managed Care – PPO | Admitting: Family Medicine

## 2011-02-04 ENCOUNTER — Encounter: Payer: Self-pay | Admitting: Family Medicine

## 2011-02-04 VITALS — BP 140/90 | Temp 98.2°F | Wt 157.0 lb

## 2011-02-04 DIAGNOSIS — M79609 Pain in unspecified limb: Secondary | ICD-10-CM

## 2011-02-04 DIAGNOSIS — L84 Corns and callosities: Secondary | ICD-10-CM

## 2011-02-04 DIAGNOSIS — Z23 Encounter for immunization: Secondary | ICD-10-CM

## 2011-02-04 NOTE — Patient Instructions (Signed)
Corns and Calluses A corn is a small area of thickened skin that usually occurs on the top, sides and tips of a toe. They contain a cone-shaped core with a point that can press on a nerve below. This causes pain. It is usually the result of rubbing  (friction) or pressure from shoes that are too tight or do not fit properly. Calluses are areas of thickened skin caused by repeated friction and pressure. They usually develop on hands, fingers, palms, soles of the feet and heels. Removing the cause of the friction is usually the only treatment needed.  SYMPTOMS OF CORNS:  A hard growth on the skin of the toes.   Pain on direct pressure against the corn.   Sometimes redness and swelling around the corn, with severe discomfort.   Increased discomfort in tight fitting shoes.  HOME CARE INSTRUCTIONS  Try to remove pressure from the affected area.   The skin can be protected with donut-shaped corn pads, available in pharmacies.   A pumice stone can be used to gently reduce the thickness of the corn.   For corns on the feet, change to properly fitted footwear.   For calluses on the hands, wear gloves during activities that cause friction.   People with diabetes should regularly examine their feet and contact their primary health care provider if they notice problems with their feet.  SEEK IMMEDIATE MEDICAL CARE IF:  You have increased pain, swelling, redness and warmth (inflammation), drainage, or bleeding.   If your corn or callus is not getting better despite treatment.  MAKE SURE YOU:   Understand these instructions.   Will watch your condition.   Will get help right away if you are not doing well or get worse.  Document Released: 01/27/2004 Document Re-Released: 07/17/2009 [facility] Patient Information 2011 [place], [place].

## 2011-02-04 NOTE — Progress Notes (Signed)
  Subjective:    Patient ID: John [name], male    DOB: 1945/08/24, 65 y.o.   MRN: [id number]  HPI Pain plantar aspect right foot. Noted over the past several weeks. History of plantar wart many years ago and symptoms somewhat similar. He is concerned about possible recurrent plantar wart. No recent change of shoe wear. Has not tried anything over-the-counter topically.  Only mild pain with walking.  Patient requesting flu vaccine. No contraindications.   Review of Systems  Constitutional: Negative for fever and chills.  Musculoskeletal: Negative for gait problem.  Skin: Negative for rash.       Objective:   Physical Exam  Constitutional: He appears well-developed and well-nourished.  Cardiovascular: Normal rate and regular rhythm.   Pulmonary/Chest: Effort normal and breath sounds normal. No respiratory distress. He has no wheezes. He has no rales.  Musculoskeletal:       Plantar aspect right foot noted midline reveals thickened somewhat linear callus tissue. No evidence for plantar wart. Minimally tender to palpation.          Assessment & Plan:  Callus right foot. Discussed option of using pads for symptom relief. Patient will file down excessive skin and callus tissue after bathing. If problem persists consider orthotics.

## 2011-03-15 ENCOUNTER — Other Ambulatory Visit: Payer: Self-pay | Admitting: Internal Medicine

## 2011-03-15 MED ORDER — ZOLPIDEM TARTRATE ER 12.5 MG PO TBCR: 12.5000 mg | EXTENDED_RELEASE_TABLET | Freq: Every evening | ORAL | Status: AC | PRN

## 2011-03-15 NOTE — Telephone Encounter (Signed)
Pt has sch a cpx for Jan 2013. Pt said that Dr Lovell Sheehan had given pt a script for Ambien 10 mg (extended release), over a year ago. Pt is traveling to [place] on 03/22/11 and is req to get a refilled called in to [facility] on [address]. Pt is req Qty of 10-12 pills.

## 2011-03-15 NOTE — Telephone Encounter (Signed)
Ok per dr Lovell Sheehan- called in and pt informed

## 2011-04-25 ENCOUNTER — Other Ambulatory Visit: Payer: Self-pay | Admitting: Internal Medicine

## 2011-05-17 ENCOUNTER — Other Ambulatory Visit (INDEPENDENT_AMBULATORY_CARE_PROVIDER_SITE_OTHER): Payer: BC Managed Care – PPO

## 2011-05-17 DIAGNOSIS — Z0000 Encounter for general adult medical examination without abnormal findings: Secondary | ICD-10-CM

## 2011-05-17 LAB — BASIC METABOLIC PANEL
CO2: 30 mEq/L (ref 19–32)
Calcium: 9.3 mg/dL (ref 8.4–10.5)
Creatinine, Ser: 1.2 mg/dL (ref 0.4–1.5)
GFR: 63.72 mL/min (ref >60.00)
Sodium: 140 mEq/L (ref 135–145)

## 2011-05-17 LAB — LIPID PANEL
HDL: 58 mg/dL (ref >39.00)
Total CHOL/HDL Ratio: 4
VLDL: 33.2 mg/dL (ref 0.0–40.0)

## 2011-05-17 LAB — POCT URINALYSIS DIPSTICK
Glucose, UA: NEGATIVE
Leukocytes, UA: NEGATIVE
Nitrite, UA: NEGATIVE
Urobilinogen, UA: 1

## 2011-05-17 LAB — CBC WITH DIFFERENTIAL/PLATELET
Basophils Absolute: 0 10*3/uL (ref 0.0–0.1)
Basophils Relative: 0.7 % (ref 0.0–3.0)
Eosinophils Absolute: 0.1 10*3/uL (ref 0.0–0.7)
Hemoglobin: 16 g/dL (ref 13.0–17.0)
Lymphocytes Relative: 22.8 % (ref 12.0–46.0)
MCHC: 34.3 g/dL (ref 30.0–36.0)
Monocytes Relative: 13.6 % — ABNORMAL HIGH (ref 3.0–12.0)
Neutrophils Relative %: 61.1 % (ref 43.0–77.0)
RBC: 4.92 Mil/uL (ref 4.22–5.81)
RDW: 13.1 % (ref 11.5–14.6)

## 2011-05-17 LAB — PSA: PSA: 0.52 ng/mL (ref 0.10–4.00)

## 2011-05-17 LAB — HEPATIC FUNCTION PANEL
AST: 30 U/L (ref 0–37)
Alkaline Phosphatase: 79 U/L (ref 39–117)
Bilirubin, Direct: 0.2 mg/dL (ref 0.0–0.3)

## 2011-05-29 ENCOUNTER — Other Ambulatory Visit: Payer: Self-pay | Admitting: *Deleted

## 2011-05-29 ENCOUNTER — Encounter: Payer: Self-pay | Admitting: Internal Medicine

## 2011-05-29 ENCOUNTER — Ambulatory Visit (INDEPENDENT_AMBULATORY_CARE_PROVIDER_SITE_OTHER): Payer: BC Managed Care – PPO | Admitting: Internal Medicine

## 2011-05-29 DIAGNOSIS — Z0000 Encounter for general adult medical examination without abnormal findings: Secondary | ICD-10-CM

## 2011-05-29 DIAGNOSIS — I10 Essential (primary) hypertension: Secondary | ICD-10-CM

## 2011-05-29 DIAGNOSIS — E785 Hyperlipidemia, unspecified: Secondary | ICD-10-CM

## 2011-05-29 DIAGNOSIS — E876 Hypokalemia: Secondary | ICD-10-CM

## 2011-05-29 MED ORDER — ROSUVASTATIN CALCIUM 20 MG PO TABS: 20.0000 mg | ORAL_TABLET | Freq: Every day | ORAL | Status: DC

## 2011-05-29 NOTE — Progress Notes (Signed)
Subjective:    Patient ID: John [name], male    DOB: 11-18-1945, 66 y.o.   MRN: [id number]  HPI  CPX potassium is elevated on screening labs and cholesterol is higher than the year before significantly  Review of Systems  Constitutional: Negative for fever and fatigue.  HENT: Negative for hearing loss, congestion, neck pain and postnasal drip.   Eyes: Negative for discharge, redness and visual disturbance.  Respiratory: Negative for cough, shortness of breath and wheezing.   Cardiovascular: Negative for leg swelling.  Gastrointestinal: Negative for abdominal pain, constipation and abdominal distention.  Genitourinary: Negative for urgency and frequency.  Musculoskeletal: Negative for joint swelling and arthralgias.  Skin: Negative for color change and rash.  Neurological: Negative for weakness and light-headedness.  Hematological: Negative for adenopathy.  Psychiatric/Behavioral: Negative for behavioral problems.   Past Medical History  Diagnosis Date  . ACTINIC KERATOSIS, EAR, LEFT 09/21/2008  . ALLERGIC RHINITIS 09/21/2008  . 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 CHOLESTEATOMA 02/07/2009  . COLONIC POLYPS, HX OF 09/21/2008  . DEPRESSION 09/21/2008  . ELEVATED BP READING WITHOUT DX HYPERTENSION 09/21/2008  . HYPERLIPIDEMIA 09/21/2008  . SEIZURE DISORDER 09/21/2008  . TRANSAMINASES, SERUM, ELEVATED 03/02/2010    History   Social History  . Marital Status: Single    Spouse Name: N/A    Number of Children: N/A  . Years of Education: N/A   Occupational History  . Not on file.   Social History Main Topics  . Smoking status: Never Smoker   . Smokeless tobacco: Not on file  . Alcohol Use: Yes  . Drug Use: No  . Sexually Active: Yes   Other Topics Concern  . Not on file   Social History Narrative  . No narrative on file    Past Surgical History  Procedure Date  . Hernia repair   . Appendectomy   . Tonsillectomy     Family History  Problem Relation Age  of Onset  . Colon cancer    . Hypertension Mother   . Hyperlipidemia Mother   . Stroke Mother     Allergies  Allergen Reactions  . Penicillins     REACTION: rash    Current Outpatient Prescriptions on File Prior to Visit  Medication Sig Dispense Refill  . Ascorbic Acid (VITAMIN C) 500 MG tablet Take 500 mg by mouth daily.        Marland Kitchen aspirin 81 MG tablet Take 81 mg by mouth daily.        Marland Kitchen atorvastatin (LIPITOR) 40 MG tablet TAKE 1 TABLET BY MOUTH ONCE A DAY  90 tablet  3  . buPROPion (WELLBUTRIN XL) 150 MG 24 hr tablet Take 1 tablet (150 mg total) by mouth daily.  30 tablet  11  . clonazePAM (KLONOPIN) 0.5 MG tablet take 1 tablet by mouth once daily if needed  30 tablet  0  . fish oil-omega-3 fatty acids 1000 MG capsule Take 2 g by mouth daily.        Marland Kitchen FLUoxetine (PROZAC) 20 MG capsule TAKE 1 CAPSULE BY MOUTH EVERY DAY  90 capsule  3  . Multiple Vitamin (MULTIVITAMIN) tablet Take 1 tablet by mouth daily.          BP 144/80  Pulse 92  Temp 98.1 F (36.7 C)  Resp 16  Ht 5\' 11"  (1.803 m)  Wt 161 lb (73.029 kg)  BMI 22.45 kg/m2        Objective:   Physical Exam  Nursing note  and vitals reviewed. Constitutional: He is oriented to person, place, and time. He appears well-developed and well-nourished.  HENT:  Head: Normocephalic and atraumatic.  Eyes: Conjunctivae are normal. Pupils are equal, round, and reactive to light.  Neck: Normal range of motion. Neck supple.  Cardiovascular: Normal rate and regular rhythm.   Pulmonary/Chest: Effort normal and breath sounds normal.  Abdominal: Soft. Bowel sounds are normal.  Genitourinary: Rectum normal and prostate normal.  Musculoskeletal: Normal range of motion.  Neurological: He is alert and oriented to person, place, and time.  Skin: Skin is warm and dry.  Psychiatric: He has a normal mood and affect. His behavior is normal.          Assessment & Plan:   Patient presents for yearly preventative medicine  examination.   all immunizations and health maintenance protocols were reviewed with the patient and they are up to date with these protocols.   screening laboratory values were reviewed with the patient including screening of hyperlipidemia PSA renal function and hepatic function.   There medications past medical history social history problem list and allergies were reviewed in detail.   Goals were established with regard to weight loss exercise diet in compliance with medications The patient may need to adjust Lipitor dose we will have the patient check CBC and 40 or 20 mg of Lipitor he is on 20 we'll increase to 40 monitor lipids in 3 months because of elevated potassium we'll recheck her potassium today

## 2011-05-29 NOTE — Patient Instructions (Signed)
Please check at home and see if you were on the 40 mg Lipitor on the 20 mg Lipitor it looks like on December 20 40 mg Lipitor was called in to [facility] if you have not picked up that prescription did go ahead and pick up the prescription and start the 40 mg tablets if you have been on the 40 mg Tablets then call our office let us know and we will change to a different lipid medication

## 2011-06-27 ENCOUNTER — Telehealth: Payer: Self-pay | Admitting: Internal Medicine

## 2011-06-27 MED ORDER — BUPROPION HCL ER (XL) 150 MG PO TB24: 150.0000 mg | ORAL_TABLET | Freq: Every day | ORAL | Status: DC

## 2011-06-27 NOTE — Telephone Encounter (Signed)
Pt needs new rx bupropion 150mg  #90 with 3 refills sent to [facility] [phone]

## 2011-08-12 ENCOUNTER — Other Ambulatory Visit: Payer: Self-pay | Admitting: Internal Medicine

## 2011-08-30 ENCOUNTER — Other Ambulatory Visit (INDEPENDENT_AMBULATORY_CARE_PROVIDER_SITE_OTHER): Payer: BC Managed Care – PPO

## 2011-08-30 DIAGNOSIS — E785 Hyperlipidemia, unspecified: Secondary | ICD-10-CM

## 2011-08-30 LAB — LIPID PANEL
Cholesterol: 216 mg/dL — ABNORMAL HIGH (ref 0–200)
HDL: 62.2 mg/dL (ref >39.00)
Triglycerides: 184 mg/dL — ABNORMAL HIGH (ref 0.0–149.0)
VLDL: 36.8 mg/dL (ref 0.0–40.0)

## 2011-08-30 LAB — HEPATIC FUNCTION PANEL
ALT: 28 U/L (ref 0–53)
AST: 30 U/L (ref 0–37)
Alkaline Phosphatase: 70 U/L (ref 39–117)
Bilirubin, Direct: 0.1 mg/dL (ref 0.0–0.3)
Total Bilirubin: 0.6 mg/dL (ref 0.3–1.2)

## 2011-09-05 ENCOUNTER — Telehealth: Payer: Self-pay | Admitting: *Deleted

## 2011-09-05 NOTE — Telephone Encounter (Signed)
Pt wants Bonnye to call him re: the results of his last lab results last week.

## 2011-09-06 NOTE — Telephone Encounter (Signed)
Labs improved - continue on crestor per dr Haskell Flirt informed

## 2011-11-11 ENCOUNTER — Telehealth: Payer: Self-pay | Admitting: Family Medicine

## 2011-11-11 NOTE — Telephone Encounter (Signed)
[facility] Triage Call Report Triage Record Num: [id number] Operator: Freddie Breech Patient Name: John [name] Call Date & Time: 11/10/2011 9:12:52AM Patient Phone: [phone] PCP: Darryll Capers Patient Gender: Male PCP Fax : [phone] Patient DOB: Sep 16, 1945 [facility] Name[facility] Reason for Call: Caller: Nirvan/Patient; PCP: Darryll Capers; CB#: [phone]; Call regarding Diarrhea; Pt is concerned he may have food poisoning after eating out last PM 11/09/11. Several bouts of diarrhea. Voiding qs per pt. Temp 100.1. Homecare advised per Diarrhea Protocol. Protocol(s) Used: Diarrhea or Other Change in Bowel Habits Recommended Outcome per Protocol: Provide Home/Self Care Reason for Outcome: Sudden onset of diarrhea, usually with abdominal pain, nausea and sometimes vomiting, occurring within 36 hours after eating unpasteurized, raw or undercooked foods OR drinking unpurified or nonchlorinated water Care Advice: Go to the ED if you have developed bloody diarrhea or signs and symptoms of dehydration, such as dry mouth and tongue; increased pulse rate at rest; concentrated urine; no urine output for 8 hours or more; increasing weakness or drowsiness, or lightheadedness when trying to sit upright or standing. ~ Call provider immediately if vomiting and diarrhea persist more than 3 days; develop a temperature of 101.5 F (38.6 C) or more; or if having severe abdominal pain or abdominal swelling. ~ ~ SYMPTOM / CONDITION MANAGEMENT Suspected Food-Borne Illness Care: Drink 2-3 quarts (2-3 liters) of low sugar content fluids, including nonprescription oral hydration solution, per day unless directed otherwise by provider. - If accompanied by vomiting, take the fluids in frequent small sips or suck on ice chips. - Do not eat solid food until vomiting subsides. - Eat easily digested foods (such as bananas, rice, applesauce, toast, cooked cereals, soup, crackers, baked or  boiled potato, or baked chicken or [place] without skin). - Do not eat high fiber, high fat, high sugar content foods, or highly seasoned foods. - Do not drink caffeinated or alcoholic beverages. - Avoid milk and milk products while having symptoms. - As symptoms improve, gradually add back to diet. - Do not use antidiarrheal medications because they may slow elimination of bacteria causing the symptoms. - Application of A&D ointment or witch hazel medicated pads may help anal irritation. - Keep activity at a minimum. - Consult your provider for advice regarding continuing prescription medication. ~ 07/

## 2011-11-20 ENCOUNTER — Ambulatory Visit (INDEPENDENT_AMBULATORY_CARE_PROVIDER_SITE_OTHER): Payer: BC Managed Care – PPO | Admitting: Family

## 2011-11-20 ENCOUNTER — Encounter: Payer: Self-pay | Admitting: Family

## 2011-11-20 VITALS — BP 120/82 | HR 76 | Wt 159.8 lb

## 2011-11-20 DIAGNOSIS — L989 Disorder of the skin and subcutaneous tissue, unspecified: Secondary | ICD-10-CM

## 2011-11-20 NOTE — Progress Notes (Signed)
Subjective:    Patient ID: John [name], male    DOB: 1946-01-15, 66 y.o.   MRN: [id number]  HPI 66 year old white male, nonsmoker, patient of Dr. Lovell Sheehan is in today to have a lesion assessed on his back. The lesions are present for several days and was initially red and tender to touch. The redness has resolved. He had minimal drainage from the pimple.   Review of Systems  Constitutional: Negative.   Respiratory: Negative.   Cardiovascular: Negative.   Skin: Negative for color change and rash.       Small red, tender pimple on his upper back.    Past Medical History  Diagnosis Date  . ACTINIC KERATOSIS, EAR, LEFT 09/21/2008  . ALLERGIC RHINITIS 09/21/2008  . 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 CHOLESTEATOMA 02/07/2009  . COLONIC POLYPS, HX OF 09/21/2008  . DEPRESSION 09/21/2008  . ELEVATED BP READING WITHOUT DX HYPERTENSION 09/21/2008  . HYPERLIPIDEMIA 09/21/2008  . SEIZURE DISORDER 09/21/2008  . TRANSAMINASES, SERUM, ELEVATED 03/02/2010    History   Social History  . Marital Status: Single    Spouse Name: N/A    Number of Children: N/A  . Years of Education: N/A   Occupational History  . Not on file.   Social History Main Topics  . Smoking status: Never Smoker   . Smokeless tobacco: Not on file  . Alcohol Use: Yes  . Drug Use: No  . Sexually Active: Yes   Other Topics Concern  . Not on file   Social History Narrative  . No narrative on file    Past Surgical History  Procedure Date  . Hernia repair   . Appendectomy   . Tonsillectomy     Family History  Problem Relation Age of Onset  . Colon cancer    . Hypertension Mother   . Hyperlipidemia Mother   . Stroke Mother     Allergies  Allergen Reactions  . Penicillins     REACTION: rash    Current Outpatient Prescriptions on File Prior to Visit  Medication Sig Dispense Refill  . Ascorbic Acid (VITAMIN C) 500 MG tablet Take 500 mg by mouth daily.        Marland Kitchen aspirin 81 MG tablet Take 81 mg by  mouth daily.        Marland Kitchen buPROPion (WELLBUTRIN XL) 150 MG 24 hr tablet Take 1 tablet (150 mg total) by mouth daily.  90 tablet  3  . clonazePAM (KLONOPIN) 0.5 MG tablet take 1 tablet by mouth once daily if needed  30 tablet  0  . fish oil-omega-3 fatty acids 1000 MG capsule Take 2 g by mouth daily.        Marland Kitchen FLUoxetine (PROZAC) 20 MG capsule TAKE 1 CAPSULE BY MOUTH EVERY DAY  90 capsule  3  . Multiple Vitamin (MULTIVITAMIN) tablet Take 1 tablet by mouth daily.        . Multiple Vitamins-Minerals (ICAPS MV PO) Take 1 capsule by mouth daily.      . rosuvastatin (CRESTOR) 20 MG tablet Take 1 tablet (20 mg total) by mouth daily.  90 tablet  3  . atorvastatin (LIPITOR) 40 MG tablet TAKE 1 TABLET BY MOUTH ONCE A DAY  90 tablet  3    BP 120/82  Pulse 76  Wt 159 lb 12.8 oz (72.485 kg)chart    Objective:   Physical Exam  Constitutional: He appears well-developed and well-nourished.  Cardiovascular: Normal rate, regular rhythm and normal heart sounds.  Pulmonary/Chest: Effort normal and breath sounds normal.  Skin: Skin is warm and dry.       Small pimple noted to the right upper back that has scabbed over and healing well. No current drainage and. No assemetry, bleeding, or irregular borders.           Assessment & Plan:  Assessment: Benign skin lesion  Plan: Skin lesion consistent with a pimple or inflamed skin follicle. Appears to be healing well. No signs and symptoms of skin cancer. Recheck as scheduled and when necessary.

## 2012-04-20 ENCOUNTER — Other Ambulatory Visit: Payer: Self-pay | Admitting: Internal Medicine

## 2012-05-09 ENCOUNTER — Other Ambulatory Visit: Payer: Self-pay | Admitting: Internal Medicine

## 2012-05-18 ENCOUNTER — Other Ambulatory Visit: Payer: Self-pay | Admitting: Internal Medicine

## 2012-05-26 ENCOUNTER — Other Ambulatory Visit: Payer: BC Managed Care – PPO

## 2012-06-02 ENCOUNTER — Other Ambulatory Visit: Payer: Self-pay | Admitting: Internal Medicine

## 2012-06-02 ENCOUNTER — Encounter: Payer: BC Managed Care – PPO | Admitting: Internal Medicine

## 2012-07-31 ENCOUNTER — Other Ambulatory Visit (INDEPENDENT_AMBULATORY_CARE_PROVIDER_SITE_OTHER): Payer: BC Managed Care – PPO

## 2012-07-31 DIAGNOSIS — Z0000 Encounter for general adult medical examination without abnormal findings: Secondary | ICD-10-CM

## 2012-07-31 LAB — POCT URINALYSIS DIPSTICK
Glucose, UA: NEGATIVE
Ketones, UA: NEGATIVE
Spec Grav, UA: 1.025

## 2012-07-31 LAB — CBC WITH DIFFERENTIAL/PLATELET
Basophils Absolute: 0 10*3/uL (ref 0.0–0.1)
Eosinophils Absolute: 0.1 10*3/uL (ref 0.0–0.7)
Hemoglobin: 16.4 g/dL (ref 13.0–17.0)
Lymphocytes Relative: 23.5 % (ref 12.0–46.0)
Lymphs Abs: 1.4 10*3/uL (ref 0.7–4.0)
MCHC: 34.5 g/dL (ref 30.0–36.0)
Monocytes Relative: 11.8 % (ref 3.0–12.0)
Neutro Abs: 3.6 10*3/uL (ref 1.4–7.7)
Platelets: 265 10*3/uL (ref 150.0–400.0)
RDW: 13 % (ref 11.5–14.6)

## 2012-07-31 LAB — BASIC METABOLIC PANEL
BUN: 11 mg/dL (ref 6–23)
CO2: 28 mEq/L (ref 19–32)
Calcium: 9.2 mg/dL (ref 8.4–10.5)
GFR: 83.7 mL/min (ref >60.00)
Glucose, Bld: 91 mg/dL (ref 70–99)
Sodium: 134 mEq/L — ABNORMAL LOW (ref 135–145)

## 2012-07-31 LAB — TSH: TSH: 2.5 u[IU]/mL (ref 0.35–5.50)

## 2012-07-31 LAB — PSA: PSA: 0.67 ng/mL (ref 0.10–4.00)

## 2012-07-31 LAB — HEPATIC FUNCTION PANEL
Alkaline Phosphatase: 81 U/L (ref 39–117)
Bilirubin, Direct: 0.1 mg/dL (ref 0.0–0.3)

## 2012-08-07 ENCOUNTER — Ambulatory Visit (INDEPENDENT_AMBULATORY_CARE_PROVIDER_SITE_OTHER): Payer: BC Managed Care – PPO | Admitting: Internal Medicine

## 2012-08-07 ENCOUNTER — Encounter: Payer: Self-pay | Admitting: Internal Medicine

## 2012-08-07 VITALS — BP 130/84 | HR 80 | Temp 98.2°F | Resp 16 | Ht 71.0 in | Wt 158.0 lb

## 2012-08-07 DIAGNOSIS — E785 Hyperlipidemia, unspecified: Secondary | ICD-10-CM

## 2012-08-07 DIAGNOSIS — Z0000 Encounter for general adult medical examination without abnormal findings: Secondary | ICD-10-CM

## 2012-08-07 NOTE — Progress Notes (Signed)
Subjective:    Patient ID: John [name], male    DOB: 06/25/1945, 67 y.o.   MRN: [id number]  HPI Patient is 67 year old male who presents for his annual examination.  He is followed for a history of chronic depression.  He is also followed for hyperlipidemia Is married his chart listing of seizure disorder which is not present He has been on crestor for hyperlipidemia and Prilosec for chronic depression and    Review of Systems  Constitutional: Negative for fever and fatigue.  HENT: Negative for hearing loss, congestion, neck pain and postnasal drip.   Eyes: Negative for discharge, redness and visual disturbance.  Respiratory: Negative for cough, shortness of breath and wheezing.   Cardiovascular: Negative for leg swelling.  Gastrointestinal: Negative for abdominal pain, constipation and abdominal distention.  Genitourinary: Negative for urgency and frequency.  Musculoskeletal: Negative for joint swelling and arthralgias.  Skin: Negative for color change and rash.  Neurological: Negative for weakness and light-headedness.  Hematological: Negative for adenopathy.  Psychiatric/Behavioral: Negative for behavioral problems.       Objective:   Physical Exam  Nursing note and vitals reviewed. Constitutional: He is oriented to person, place, and time. He appears well-developed and well-nourished.  HENT:  Head: Normocephalic and atraumatic.  Eyes: Conjunctivae are normal. Pupils are equal, round, and reactive to light.  Neck: Normal range of motion. Neck supple.  Cardiovascular: Normal rate and regular rhythm.   Pulmonary/Chest: Effort normal and breath sounds normal.  Abdominal: Soft. Bowel sounds are normal.  Genitourinary: Rectum normal and prostate normal.  Musculoskeletal: Normal range of motion.  Neurological: He is alert and oriented to person, place, and time.  Skin: Skin is warm and dry.  Psychiatric: He has a normal mood and affect. His behavior is normal.     Past Medical History  Diagnosis Date  . ACTINIC KERATOSIS, EAR, LEFT 09/21/2008  . ALLERGIC RHINITIS 09/21/2008  . 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 CHOLESTEATOMA 02/07/2009  . COLONIC POLYPS, HX OF 09/21/2008  . DEPRESSION 09/21/2008  . ELEVATED BP READING WITHOUT DX HYPERTENSION 09/21/2008  . HYPERLIPIDEMIA 09/21/2008  . SEIZURE DISORDER 09/21/2008  . TRANSAMINASES, SERUM, ELEVATED 03/02/2010    History   Social History  . Marital Status: Single    Spouse Name: N/A    Number of Children: N/A  . Years of Education: N/A   Occupational History  . Not on file.   Social History Main Topics  . Smoking status: Never Smoker   . Smokeless tobacco: Not on file  . Alcohol Use: Yes  . Drug Use: No  . Sexually Active: Yes   Other Topics Concern  . Not on file   Social History Narrative  . No narrative on file    Past Surgical History  Procedure Laterality Date  . Hernia repair    . Appendectomy    . Tonsillectomy      Family History  Problem Relation Age of Onset  . Colon cancer    . Hypertension Mother   . Hyperlipidemia Mother   . Stroke Mother     Allergies  Allergen Reactions  . Penicillins     REACTION: rash    Current Outpatient Prescriptions on File Prior to Visit  Medication Sig Dispense Refill  . Ascorbic Acid (VITAMIN C) 500 MG tablet Take 500 mg by mouth daily.        Marland Kitchen aspirin 81 MG tablet Take 81 mg by mouth daily.        Marland Kitchen  buPROPion (WELLBUTRIN XL) 150 MG 24 hr tablet TAKE 1 TABLET (150 MG TOTAL) BY MOUTH DAILY.  90 tablet  3  . clonazePAM (KLONOPIN) 0.5 MG tablet take 1 tablet by mouth once daily  30 tablet  5  . CRESTOR 20 MG tablet TAKE 1 TABLET BY MOUTH DAILY.  90 tablet  3  . fish oil-omega-3 fatty acids 1000 MG capsule Take 2 g by mouth daily.        Marland Kitchen FLUoxetine (PROZAC) 20 MG capsule TAKE 1 CAPSULE BY MOUTH EVERY DAY  90 capsule  3  . Multiple Vitamin (MULTIVITAMIN) tablet Take 1 tablet by mouth daily.        . Multiple  Vitamins-Minerals (ICAPS MV PO) Take 1 capsule by mouth daily.      Marland Kitchen atorvastatin (LIPITOR) 40 MG tablet TAKE 1 TABLET BY MOUTH ONCE A DAY  90 tablet  3   No current facility-administered medications on file prior to visit.    BP 130/84  Pulse 80  Temp(Src) 98.2 F (36.8 C)  Resp 16  Ht 5\' 11"  (1.803 m)  Wt 158 lb (71.668 kg)  BMI 22.05 kg/m2  EMR is showing that he is on Lipitor while he is not on Lipitor      Assessment & Plan:   Patient presents for yearly preventative medicine examination.   all immunizations and health maintenance protocols were reviewed with the patient and they are up to date with these protocols.   screening laboratory values were reviewed with the patient including screening of hyperlipidemia PSA renal function and hepatic function.   There medications past medical history social history problem list and allergies were reviewed in detail.   Goals were established with regard to weight loss exercise diet in compliance with medications

## 2012-08-07 NOTE — Patient Instructions (Signed)
The patient is instructed to continue all medications as prescribed. Schedule followup with check out clerk upon leaving the clinic  

## 2012-11-15 ENCOUNTER — Other Ambulatory Visit: Payer: Self-pay | Admitting: Internal Medicine

## 2012-12-24 ENCOUNTER — Telehealth: Payer: Self-pay | Admitting: Internal Medicine

## 2012-12-24 MED ORDER — BUPROPION HCL ER (XL) 150 MG PO TB24: ORAL_TABLET | ORAL | Status: DC

## 2012-12-24 NOTE — Telephone Encounter (Signed)
[facility] doesn have it so I sent script to them.  Left message on machine For pt and pharmacy-[facility]

## 2012-12-24 NOTE — Telephone Encounter (Signed)
Pt's buPROPion (WELLBUTRIN XL) 150 MG 24 hr tablet is on backorder til October. Pharm would like to know if you want to RX something else, or what to do?? ls advise Pharm states they will transfer out if pt want to or if someone even has it.

## 2013-02-09 ENCOUNTER — Ambulatory Visit (INDEPENDENT_AMBULATORY_CARE_PROVIDER_SITE_OTHER): Payer: BC Managed Care – PPO

## 2013-02-09 DIAGNOSIS — Z23 Encounter for immunization: Secondary | ICD-10-CM

## 2013-02-17 ENCOUNTER — Other Ambulatory Visit: Payer: BC Managed Care – PPO

## 2013-03-01 ENCOUNTER — Other Ambulatory Visit (INDEPENDENT_AMBULATORY_CARE_PROVIDER_SITE_OTHER): Payer: BC Managed Care – PPO

## 2013-03-01 DIAGNOSIS — E785 Hyperlipidemia, unspecified: Secondary | ICD-10-CM

## 2013-03-01 LAB — BASIC METABOLIC PANEL
BUN: 9 mg/dL (ref 6–23)
Chloride: 100 mEq/L (ref 96–112)
Creatinine, Ser: 1 mg/dL (ref 0.4–1.5)
GFR: 77.9 mL/min (ref >60.00)
Glucose, Bld: 108 mg/dL — ABNORMAL HIGH (ref 70–99)
Potassium: 4.4 mEq/L (ref 3.5–5.1)

## 2013-03-01 LAB — HEPATIC FUNCTION PANEL
ALT: 35 U/L (ref 0–53)
Alkaline Phosphatase: 84 U/L (ref 39–117)
Bilirubin, Direct: 0.1 mg/dL (ref 0.0–0.3)
Total Bilirubin: 0.7 mg/dL (ref 0.3–1.2)

## 2013-03-01 LAB — LIPID PANEL
Cholesterol: 242 mg/dL — ABNORMAL HIGH (ref 0–200)
HDL: 62 mg/dL (ref >39.00)
VLDL: 37.6 mg/dL (ref 0.0–40.0)

## 2013-03-05 ENCOUNTER — Other Ambulatory Visit: Payer: Self-pay | Admitting: *Deleted

## 2013-03-08 ENCOUNTER — Telehealth: Payer: Self-pay | Admitting: Internal Medicine

## 2013-03-08 NOTE — Telephone Encounter (Signed)
Per dr Lovell Sheehan- ok to stay on ambien 12.5;going to [place] and really needs that dose

## 2013-03-08 NOTE — Telephone Encounter (Signed)
[employer] advised pt that zolpidem (AMBIEN CR) 12.5 MG CR tablet Is too high a dose for a pt over 65 yrs of age. Pt would like for you to advise him on this. pls call

## 2013-03-09 ENCOUNTER — Encounter: Payer: Self-pay | Admitting: Internal Medicine

## 2013-05-07 ENCOUNTER — Other Ambulatory Visit: Payer: Self-pay | Admitting: Internal Medicine

## 2013-08-13 ENCOUNTER — Other Ambulatory Visit: Payer: BC Managed Care – PPO

## 2013-08-20 ENCOUNTER — Encounter: Payer: BC Managed Care – PPO | Admitting: Internal Medicine

## 2013-09-06 ENCOUNTER — Encounter: Payer: Self-pay | Admitting: Internal Medicine

## 2013-09-13 ENCOUNTER — Telehealth: Payer: Self-pay | Admitting: *Deleted

## 2013-09-13 NOTE — Telephone Encounter (Signed)
Pt called wanting to know if Dr Arnoldo Morale has heard anything from Dr Virgina Jock.  Per Dr Arnoldo Morale he has sent him an email and has not heard back yet.  Pt is aware.  I told pt to call back next week if he has still not heard anything.  Pt verbalized understanding and had no questions

## 2013-09-30 ENCOUNTER — Other Ambulatory Visit (INDEPENDENT_AMBULATORY_CARE_PROVIDER_SITE_OTHER): Payer: BC Managed Care – PPO

## 2013-09-30 DIAGNOSIS — Z0000 Encounter for general adult medical examination without abnormal findings: Secondary | ICD-10-CM

## 2013-09-30 LAB — CBC WITH DIFFERENTIAL/PLATELET
BASOS PCT: 0.5 % (ref 0.0–3.0)
Basophils Absolute: 0 10*3/uL (ref 0.0–0.1)
EOS PCT: 1.5 % (ref 0.0–5.0)
Eosinophils Absolute: 0.1 10*3/uL (ref 0.0–0.7)
HEMATOCRIT: 44.8 % (ref 39.0–52.0)
HEMOGLOBIN: 15.3 g/dL (ref 13.0–17.0)
LYMPHS ABS: 1.2 10*3/uL (ref 0.7–4.0)
LYMPHS PCT: 23.7 % (ref 12.0–46.0)
MCHC: 34.3 g/dL (ref 30.0–36.0)
MCV: 91.4 fl (ref 78.0–100.0)
Monocytes Absolute: 0.6 10*3/uL (ref 0.1–1.0)
Monocytes Relative: 12.2 % — ABNORMAL HIGH (ref 3.0–12.0)
NEUTROS ABS: 3.2 10*3/uL (ref 1.4–7.7)
Neutrophils Relative %: 62.1 % (ref 43.0–77.0)
Platelets: 210 10*3/uL (ref 150.0–400.0)
RBC: 4.9 Mil/uL (ref 4.22–5.81)
RDW: 13 % (ref 11.5–15.5)
WBC: 5.1 10*3/uL (ref 4.0–10.5)

## 2013-09-30 LAB — POCT URINALYSIS DIPSTICK
Glucose, UA: NEGATIVE
Leukocytes, UA: NEGATIVE
Nitrite, UA: NEGATIVE
PH UA: 5.5
Urobilinogen, UA: 0.2

## 2013-09-30 LAB — HEPATIC FUNCTION PANEL
ALBUMIN: 4.1 g/dL (ref 3.5–5.2)
ALT: 26 U/L (ref 0–53)
AST: 34 U/L (ref 0–37)
Alkaline Phosphatase: 67 U/L (ref 39–117)
Bilirubin, Direct: 0.2 mg/dL (ref 0.0–0.3)
Total Bilirubin: 1 mg/dL (ref 0.2–1.2)
Total Protein: 6.5 g/dL (ref 6.0–8.3)

## 2013-09-30 LAB — BASIC METABOLIC PANEL
BUN: 9 mg/dL (ref 6–23)
CO2: 29 mEq/L (ref 19–32)
Calcium: 9 mg/dL (ref 8.4–10.5)
Chloride: 100 mEq/L (ref 96–112)
Creatinine, Ser: 0.9 mg/dL (ref 0.4–1.5)
GFR: 88.7 mL/min (ref >60.00)
GLUCOSE: 88 mg/dL (ref 70–99)
POTASSIUM: 3.9 meq/L (ref 3.5–5.1)
SODIUM: 137 meq/L (ref 135–145)

## 2013-09-30 LAB — PSA: PSA: 0.54 ng/mL (ref 0.10–4.00)

## 2013-09-30 LAB — LIPID PANEL
CHOL/HDL RATIO: 4
Cholesterol: 221 mg/dL — ABNORMAL HIGH (ref 0–200)
HDL: 62.2 mg/dL (ref >39.00)
LDL CALC: 125 mg/dL — AB (ref 0–99)
Triglycerides: 169 mg/dL — ABNORMAL HIGH (ref 0.0–149.0)
VLDL: 33.8 mg/dL (ref 0.0–40.0)

## 2013-09-30 LAB — TSH: TSH: 3.05 u[IU]/mL (ref 0.35–4.50)

## 2013-10-25 ENCOUNTER — Ambulatory Visit (INDEPENDENT_AMBULATORY_CARE_PROVIDER_SITE_OTHER): Payer: BC Managed Care – PPO | Admitting: Family Medicine

## 2013-10-25 ENCOUNTER — Encounter: Payer: BC Managed Care – PPO | Admitting: Internal Medicine

## 2013-10-25 ENCOUNTER — Ambulatory Visit: Payer: BC Managed Care – PPO | Admitting: Family

## 2013-10-25 ENCOUNTER — Encounter: Payer: Self-pay | Admitting: Family Medicine

## 2013-10-25 VITALS — BP 130/80 | HR 90 | Temp 98.3°F | Wt 161.0 lb

## 2013-10-25 DIAGNOSIS — E785 Hyperlipidemia, unspecified: Secondary | ICD-10-CM

## 2013-10-25 DIAGNOSIS — L821 Other seborrheic keratosis: Secondary | ICD-10-CM

## 2013-10-25 DIAGNOSIS — L909 Atrophic disorder of skin, unspecified: Secondary | ICD-10-CM

## 2013-10-25 DIAGNOSIS — L919 Hypertrophic disorder of the skin, unspecified: Secondary | ICD-10-CM

## 2013-10-25 DIAGNOSIS — L918 Other hypertrophic disorders of the skin: Secondary | ICD-10-CM

## 2013-10-25 DIAGNOSIS — L988 Other specified disorders of the skin and subcutaneous tissue: Secondary | ICD-10-CM

## 2013-10-25 DIAGNOSIS — L820 Inflamed seborrheic keratosis: Secondary | ICD-10-CM

## 2013-10-25 MED ORDER — BUPROPION HCL ER (XL) 150 MG PO TB24: ORAL_TABLET | ORAL | Status: DC

## 2013-10-25 NOTE — Progress Notes (Signed)
   Subjective:    Patient ID: John [name], male    DOB: 12/08/1945, 68 y.o.   MRN: [id number]  HPI Patient is here to discuss recent labs. He had been scheduled for physical but this was canceled. He has hyperlipidemia treated with Crestor 20 mg daily. He also takes medication for depression. Overall stable. No history of CAD or peripheral vascular disease  Several skin lesions he would like to have treated. Irritated skin tag right neck. Verrucous-type lesion posterior to right knee. Scaly lesion left face which frequently is scraped off with shaving. No history of skin cancer  Past Medical History  Diagnosis Date  . ACTINIC KERATOSIS, EAR, LEFT 09/21/2008  . ALLERGIC RHINITIS 09/21/2008  . 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 CHOLESTEATOMA 02/07/2009  . COLONIC POLYPS, HX OF 09/21/2008  . DEPRESSION 09/21/2008  . ELEVATED BP READING WITHOUT DX HYPERTENSION 09/21/2008  . HYPERLIPIDEMIA 09/21/2008  . SEIZURE DISORDER 09/21/2008  . TRANSAMINASES, SERUM, ELEVATED 03/02/2010   Past Surgical History  Procedure Laterality Date  . Hernia repair    . Appendectomy    . Tonsillectomy      reports that he has never smoked. He does not have any smokeless tobacco history on file. He reports that he drinks alcohol. He reports that he does not use illicit drugs. family history includes Colon cancer in an other family member; Hyperlipidemia in his mother; Hypertension in his mother; Stroke in his mother. Allergies  Allergen Reactions  . Penicillins     REACTION: rash      Review of Systems  Constitutional: Negative for fever, chills, appetite change and unexpected weight change.  Respiratory: Negative for shortness of breath.   Cardiovascular: Negative for chest pain.       Objective:   Physical Exam  Constitutional: He appears well-developed and well-nourished. No distress.  Cardiovascular: Normal rate.   Pulmonary/Chest: Effort normal and breath sounds normal. No respiratory  distress. He has no wheezes. He has no rales.  Skin:  Irritated skin tag right lower neck region. 7-8 mm hyperkeratotic verrucous lesion posterior to right knee. Scaly well-demarcated symmetric brownish scaly lesion left cheek          Assessment & Plan:  #1 hyperlipidemia. Recent labs reviewed. Continue Crestor #2 irritated skin tag right neck. Treated with liquid nitrogen-after discussion with patient of risks and benefits and  He tolerated  Well. #3 seborrheic keratosis left cheek.  Treated with liquid nitrogen after discussed risk and benefits #4 probable verrucous keratosis posterior to right knee.  Treated with liquid nitrogen

## 2013-10-25 NOTE — Patient Instructions (Signed)
Seborrheic Keratosis Seborrheic keratosis is a common, noncancerous (benign) skin growth that can occur anywhere on the skin.It looks like "stuck-on," waxy, rough, tan, brown, or black spots on the skin. These skin growths can be flat or raised.They are often called "barnacles" because of their pasted-on appearance.Usually, these skin growths appear in adulthood, around age 66, and increase in number as you age. They may also develop during pregnancy or following estrogen therapy. Many people may only have one growth appear in their lifetime, while some people may develop many growths. CAUSES It is unknown what causes these skin growths, but they appear to run in families. SYMPTOMS Seborrheic keratosis is often located on the face, chest, shoulders, back, or other areas. These growths are:  Usually painless, but may become irritated and itchy.  Yellow, brown, black, or other colors.  Slightly raised or have a flat surface.  Sometimes rough or wart-like in texture.  Often waxy on the surface.  Round or oval-shaped.  Sometimes "stuck-on" in appearance.  Sometimes single, but there are usually many growths. Any growth that bleeds, itches on a regular basis, becomes inflamed, or becomes irritated needs to be evaluated by a skin specialist (dermatologist). DIAGNOSIS Diagnosis is mainly based on the way the growths appear. In some cases, it can be difficult to tell this type of skin growth from skin cancer. A skin growth tissue sample (biopsy) may be used to confirm the diagnosis. TREATMENT Most often, treatment is not needed because the skin growths are benign.If the skin growth is irritated easily by clothing or jewelry, causing it to scab or bleed, treatment may be recommended. Patients may also choose to have the growths removed because they do not like their appearance. Most commonly, these growths are treated with cryosurgery. In cryosurgery, liquid nitrogen is applied to "freeze" the  growth. The growth usually falls off within a matter of days. A blister may form and dry into a scab that will also fall off. After the growth or scab falls off, it may leave a dark or light spot on the skin. This color may fade over time, or it may remain permanent on the skin. HOME CARE INSTRUCTIONS If the skin growths are treated with cryosurgery, the treated area needs to be kept clean with water and soap. SEEK MEDICAL CARE IF:  You have questions about these growths or other skin problems.  You develop new symptoms, including:  A change in the appearance of the skin growth.  New growths.  Any bleeding, itching, or pain in the growths.  A skin growth that looks similar to seborrheic keratosis. Document Released: 05/25/2010 Document Revised: 07/15/2011 Document Reviewed: 05/25/2010 ExitCare Patient Information 2015 [employer]. This information is not intended to replace advice given to you by your health care provider. Make sure you discuss any questions you have with your health care provider.  

## 2013-10-25 NOTE — Progress Notes (Signed)
Pre visit review using our clinic review tool, if applicable. No additional management support is needed unless otherwise documented below in the visit note. 

## 2013-11-07 ENCOUNTER — Other Ambulatory Visit: Payer: Self-pay | Admitting: Internal Medicine

## 2013-12-30 ENCOUNTER — Encounter: Payer: Self-pay | Admitting: Gastroenterology

## 2015-02-10 ENCOUNTER — Encounter: Payer: Self-pay | Admitting: Gastroenterology

## 2015-04-06 ENCOUNTER — Ambulatory Visit (AMBULATORY_SURGERY_CENTER): Payer: Self-pay

## 2015-04-06 VITALS — Ht 71.0 in | Wt 163.0 lb

## 2015-04-06 DIAGNOSIS — Z86010 Personal history of colonic polyps: Secondary | ICD-10-CM

## 2015-04-06 MED ORDER — NA SULFATE-K SULFATE-MG SULF 17.5-3.13-1.6 GM/177ML PO SOLN: 1.0000 | Freq: Once | ORAL | Status: DC

## 2015-04-06 NOTE — Progress Notes (Signed)
No egg or soy allergies Not on home 02 No previous anesthesia complications No diet or weight loss meds 

## 2015-04-20 ENCOUNTER — Ambulatory Visit (AMBULATORY_SURGERY_CENTER): Payer: Commercial Managed Care - HMO | Admitting: Gastroenterology

## 2015-04-20 ENCOUNTER — Encounter: Payer: Self-pay | Admitting: Gastroenterology

## 2015-04-20 VITALS — BP 132/90 | HR 81 | Temp 97.2°F | Resp 16 | Ht 71.0 in | Wt 163.0 lb

## 2015-04-20 DIAGNOSIS — D123 Benign neoplasm of transverse colon: Secondary | ICD-10-CM

## 2015-04-20 DIAGNOSIS — K621 Rectal polyp: Secondary | ICD-10-CM | POA: Diagnosis not present

## 2015-04-20 DIAGNOSIS — Z86010 Personal history of colonic polyps: Secondary | ICD-10-CM | POA: Diagnosis not present

## 2015-04-20 DIAGNOSIS — D124 Benign neoplasm of descending colon: Secondary | ICD-10-CM

## 2015-04-20 DIAGNOSIS — D120 Benign neoplasm of cecum: Secondary | ICD-10-CM

## 2015-04-20 MED ORDER — SODIUM CHLORIDE 0.9 % IV SOLN: 500.0000 mL | INTRAVENOUS | Status: DC

## 2015-04-20 NOTE — Progress Notes (Signed)
Called to room to assist during endoscopic procedure.  Patient ID and intended procedure confirmed with present staff. Received instructions for my participation in the procedure from the performing physician.  

## 2015-04-20 NOTE — Progress Notes (Signed)
A/ox3 pleased with MAC, report to Jill RN 

## 2015-04-20 NOTE — Op Note (Signed)
[facility]  [address] [place], [address]   COLONOSCOPY PROCEDURE REPORT  PATIENT: John [name], John [name]  MR#: [id number] BIRTHDATE: 05/02/1946 , 44  yrs. old GENDER: male ENDOSCOPIST: Ladene Artist, MD, Marval Regal REFERRED BY:  Shon Baton, M.D. PROCEDURE DATE:  04/20/2015 PROCEDURE:   Colonoscopy, surveillance , Colonoscopy with biopsy, and Colonoscopy with snare polypectomy First Screening Colonoscopy - Avg.  risk and is 50 yrs.  old or older - No.  Prior Negative Screening - Now for repeat screening. N/A  History of Adenoma - Now for follow-up colonoscopy & has been > or = to 3 yrs.  Yes hx of adenoma.  Has been 3 or more years since last colonoscopy.  Polyps removed today? Yes ASA CLASS:   Class II INDICATIONS:Surveillance due to prior colonic neoplasia and PH Colon Adenoma. MEDICATIONS: Monitored anesthesia care and Propofol 200 mg IV DESCRIPTION OF PROCEDURE:   After the risks benefits and alternatives of the procedure were thoroughly explained, informed consent was obtained.  The digital rectal exam revealed no abnormalities of the rectum.   The LB [id number]-H190 [id number]  endoscope was introduced through the anus and advanced to the cecum, which was identified by both the appendix and ileocecal valve. No adverse events experienced.   The quality of the prep was excellent. (Suprep was used)  The instrument was then slowly withdrawn as the colon was fully examined. Estimated blood loss is zero unless otherwise noted in this procedure report.    COLON FINDINGS: Two sessile polyps measuring 4 mm in size were found in the descending colon and at the ileocecal valve.  Polypectomies were performed with cold forceps.  The resection was complete, the polyp tissue was completely retrieved and sent to histology.   A sessile polyp measuring 6 mm in size was found in the transverse colon.  A polypectomy was performed with a cold snare.  The resection was complete, the polyp  tissue was completely retrieved and sent to histology.   The examination was otherwise normal. Retroflexed views revealed no abnormalities. The time to cecum = 2.6 Withdrawal time = 10.2   The scope was withdrawn and the procedure completed. COMPLICATIONS: There were no immediate complications.  ENDOSCOPIC IMPRESSION: 1.   Two sessile polyps in the descending colon and at the ileocecal valve; polypectomies performed with cold forceps 2.   Sessile polyp in the transverse colon; polypectomy performed with a cold snare  RECOMMENDATIONS: 1.  Await pathology results 2.  Repeat Colonoscopy in 5 years.  eSigned:  Ladene Artist, MD, [facility] 04/20/2015 9:55 AM

## 2015-04-20 NOTE — Patient Instructions (Signed)
YOU HAD AN ENDOSCOPIC PROCEDURE TODAY AT THE [facility]:   Refer to the procedure report that was given to you for any specific questions about what was found during the examination.  If the procedure report does not answer your questions, please call your gastroenterologist to clarify.  If you requested that your care partner not be given the details of your procedure findings, then the procedure report has been included in a sealed envelope for you to review at your convenience later.  YOU SHOULD EXPECT: Some feelings of bloating in the abdomen. Passage of more gas than usual.  Walking can help get rid of the air that was put into your GI tract during the procedure and reduce the bloating. If you had a lower endoscopy (such as a colonoscopy or flexible sigmoidoscopy) you may notice spotting of blood in your stool or on the toilet paper. If you underwent a bowel prep for your procedure, you may not have a normal bowel movement for a few days.  Please Note:  You might notice some irritation and congestion in your nose or some drainage.  This is from the oxygen used during your procedure.  There is no need for concern and it should clear up in a day or so.  SYMPTOMS TO REPORT IMMEDIATELY:   Following lower endoscopy (colonoscopy or flexible sigmoidoscopy):  Excessive amounts of blood in the stool  Significant tenderness or worsening of abdominal pains  Swelling of the abdomen that is new, acute  Fever of 100F or higher   For urgent or emergent issues, a gastroenterologist can be reached at any hour by calling [phone].   DIET: Your first meal following the procedure should be a small meal and then it is ok to progress to your normal diet. Heavy or fried foods are harder to digest and may make you feel nauseous or bloated.  Likewise, meals heavy in dairy and vegetables can increase bloating.  Drink plenty of fluids but you should avoid alcoholic beverages for 24  hours.  ACTIVITY:  You should plan to take it easy for the rest of today and you should NOT DRIVE or use heavy machinery until tomorrow (because of the sedation medicines used during the test).    FOLLOW UP: Our staff will call the number listed on your records the next business day following your procedure to check on you and address any questions or concerns that you may have regarding the information given to you following your procedure. If we do not reach you, we will leave a message.  However, if you are feeling well and you are not experiencing any problems, there is no need to return our call.  We will assume that you have returned to your regular daily activities without incident.  If any biopsies were taken you will be contacted by phone or by letter within the next 1-3 weeks.  Please call us at [phone] if you have not heard about the biopsies in 3 weeks.    SIGNATURES/CONFIDENTIALITY: You and/or your care partner have signed paperwork which will be entered into your electronic medical record.  These signatures attest to the fact that that the information above on your After Visit Summary has been reviewed and is understood.  Full responsibility of the confidentiality of this discharge information lies with you and/or your care-partner. 

## 2015-04-21 ENCOUNTER — Telehealth: Payer: Self-pay | Admitting: Emergency Medicine

## 2015-04-21 NOTE — Telephone Encounter (Signed)
  Follow up Call-  Call back number 04/20/2015  Post procedure Call Back phone  # [phone]  Permission to leave phone message Yes     Patient questions:  Do you have a fever, pain , or abdominal swelling? No. Pain Score  0 *  Have you tolerated food without any problems? Yes.    Have you been able to return to your normal activities? Yes.    Do you have any questions about your discharge instructions: Diet   No. Medications  No. Follow up visit  No.  Do you have questions or concerns about your Care? No.  Actions: * If pain score is 4 or above: No action needed, pain <4.

## 2015-04-28 ENCOUNTER — Encounter: Payer: Self-pay | Admitting: Gastroenterology

## 2016-05-16 DIAGNOSIS — H524 Presbyopia: Secondary | ICD-10-CM | POA: Diagnosis not present

## 2016-05-16 DIAGNOSIS — H25813 Combined forms of age-related cataract, bilateral: Secondary | ICD-10-CM | POA: Diagnosis not present

## 2016-05-16 DIAGNOSIS — H31002 Unspecified chorioretinal scars, left eye: Secondary | ICD-10-CM | POA: Diagnosis not present

## 2016-05-16 DIAGNOSIS — D3132 Benign neoplasm of left choroid: Secondary | ICD-10-CM | POA: Diagnosis not present

## 2016-05-17 DIAGNOSIS — Z0100 Encounter for examination of eyes and vision without abnormal findings: Secondary | ICD-10-CM | POA: Diagnosis not present

## 2016-06-10 DIAGNOSIS — Z6824 Body mass index (BMI) 24.0-24.9, adult: Secondary | ICD-10-CM | POA: Diagnosis not present

## 2016-06-10 DIAGNOSIS — M79674 Pain in right toe(s): Secondary | ICD-10-CM | POA: Diagnosis not present

## 2016-06-12 DIAGNOSIS — R69 Illness, unspecified: Secondary | ICD-10-CM | POA: Diagnosis not present

## 2016-07-29 DIAGNOSIS — M79641 Pain in right hand: Secondary | ICD-10-CM | POA: Diagnosis not present

## 2016-07-29 DIAGNOSIS — M65331 Trigger finger, right middle finger: Secondary | ICD-10-CM | POA: Diagnosis not present

## 2016-08-05 DIAGNOSIS — M65331 Trigger finger, right middle finger: Secondary | ICD-10-CM | POA: Diagnosis not present

## 2016-08-26 DIAGNOSIS — M65331 Trigger finger, right middle finger: Secondary | ICD-10-CM | POA: Diagnosis not present

## 2016-09-26 DIAGNOSIS — R030 Elevated blood-pressure reading, without diagnosis of hypertension: Secondary | ICD-10-CM | POA: Diagnosis not present

## 2016-09-26 DIAGNOSIS — E784 Other hyperlipidemia: Secondary | ICD-10-CM | POA: Diagnosis not present

## 2016-09-26 DIAGNOSIS — Z125 Encounter for screening for malignant neoplasm of prostate: Secondary | ICD-10-CM | POA: Diagnosis not present

## 2016-10-01 DIAGNOSIS — L821 Other seborrheic keratosis: Secondary | ICD-10-CM | POA: Diagnosis not present

## 2016-10-01 DIAGNOSIS — J3089 Other allergic rhinitis: Secondary | ICD-10-CM | POA: Diagnosis not present

## 2016-10-01 DIAGNOSIS — R030 Elevated blood-pressure reading, without diagnosis of hypertension: Secondary | ICD-10-CM | POA: Diagnosis not present

## 2016-10-01 DIAGNOSIS — R69 Illness, unspecified: Secondary | ICD-10-CM | POA: Diagnosis not present

## 2016-10-01 DIAGNOSIS — M6530 Trigger finger, unspecified finger: Secondary | ICD-10-CM | POA: Diagnosis not present

## 2016-10-01 DIAGNOSIS — Z0000 Encounter for general adult medical examination without abnormal findings: Secondary | ICD-10-CM | POA: Diagnosis not present

## 2016-10-01 DIAGNOSIS — C801 Malignant (primary) neoplasm, unspecified: Secondary | ICD-10-CM | POA: Diagnosis not present

## 2016-10-01 DIAGNOSIS — H3530 Unspecified macular degeneration: Secondary | ICD-10-CM | POA: Diagnosis not present

## 2016-10-01 DIAGNOSIS — E784 Other hyperlipidemia: Secondary | ICD-10-CM | POA: Diagnosis not present

## 2016-10-01 DIAGNOSIS — F1099 Alcohol use, unspecified with unspecified alcohol-induced disorder: Secondary | ICD-10-CM | POA: Diagnosis not present

## 2016-10-02 ENCOUNTER — Other Ambulatory Visit: Payer: Self-pay | Admitting: Orthopedic Surgery

## 2016-10-02 ENCOUNTER — Encounter (HOSPITAL_BASED_OUTPATIENT_CLINIC_OR_DEPARTMENT_OTHER): Payer: Self-pay | Admitting: *Deleted

## 2016-10-02 DIAGNOSIS — M65331 Trigger finger, right middle finger: Secondary | ICD-10-CM | POA: Diagnosis not present

## 2016-10-02 DIAGNOSIS — M65311 Trigger thumb, right thumb: Secondary | ICD-10-CM | POA: Diagnosis not present

## 2016-10-02 DIAGNOSIS — M65321 Trigger finger, right index finger: Secondary | ICD-10-CM | POA: Diagnosis not present

## 2016-10-02 DIAGNOSIS — Z1212 Encounter for screening for malignant neoplasm of rectum: Secondary | ICD-10-CM | POA: Diagnosis not present

## 2016-10-02 DIAGNOSIS — M65341 Trigger finger, right ring finger: Secondary | ICD-10-CM | POA: Diagnosis not present

## 2016-10-07 ENCOUNTER — Encounter (HOSPITAL_BASED_OUTPATIENT_CLINIC_OR_DEPARTMENT_OTHER): Payer: Self-pay

## 2016-10-08 ENCOUNTER — Ambulatory Visit (HOSPITAL_BASED_OUTPATIENT_CLINIC_OR_DEPARTMENT_OTHER): Payer: Medicare HMO | Admitting: Certified Registered"

## 2016-10-08 ENCOUNTER — Encounter (HOSPITAL_BASED_OUTPATIENT_CLINIC_OR_DEPARTMENT_OTHER)
Admission: RE | Disposition: A | Discharge status: Home / Self Care | Payer: Self-pay | Source: Ambulatory Visit | Attending: Orthopedic Surgery

## 2016-10-08 ENCOUNTER — Encounter (HOSPITAL_BASED_OUTPATIENT_CLINIC_OR_DEPARTMENT_OTHER): Payer: Self-pay

## 2016-10-08 ENCOUNTER — Ambulatory Visit (HOSPITAL_BASED_OUTPATIENT_CLINIC_OR_DEPARTMENT_OTHER)
Admission: RE | Admit: 2016-10-08 | Discharge: 2016-10-08 | Disposition: A | Discharge status: Home / Self Care | Payer: Medicare HMO | Source: Ambulatory Visit | Attending: Orthopedic Surgery | Admitting: Orthopedic Surgery

## 2016-10-08 DIAGNOSIS — M65321 Trigger finger, right index finger: Secondary | ICD-10-CM | POA: Diagnosis not present

## 2016-10-08 DIAGNOSIS — I10 Essential (primary) hypertension: Secondary | ICD-10-CM | POA: Insufficient documentation

## 2016-10-08 DIAGNOSIS — M65341 Trigger finger, right ring finger: Secondary | ICD-10-CM | POA: Insufficient documentation

## 2016-10-08 DIAGNOSIS — M65311 Trigger thumb, right thumb: Secondary | ICD-10-CM | POA: Insufficient documentation

## 2016-10-08 DIAGNOSIS — M65331 Trigger finger, right middle finger: Secondary | ICD-10-CM | POA: Diagnosis not present

## 2016-10-08 HISTORY — DX: Anxiety disorder, unspecified: F41.9

## 2016-10-08 HISTORY — PX: TRIGGER FINGER RELEASE: SHX641

## 2016-10-08 SURGERY — RELEASE, A1 PULLEY, FOR TRIGGER FINGER
Anesthesia: Regional | Site: Hand | Laterality: Right

## 2016-10-08 MED ORDER — VANCOMYCIN HCL IN DEXTROSE 1-5 GM/200ML-% IV SOLN
INTRAVENOUS | Status: AC
  Filled 2016-10-08: qty 200

## 2016-10-08 MED ORDER — FENTANYL CITRATE (PF) 100 MCG/2ML IJ SOLN
50.0000 ug | INTRAMUSCULAR | Status: DC | PRN
  Administered 2016-10-08: 100 ug via INTRAVENOUS

## 2016-10-08 MED ORDER — SCOPOLAMINE 1 MG/3DAYS TD PT72: 1.0000 | MEDICATED_PATCH | Freq: Once | TRANSDERMAL | Status: DC | PRN

## 2016-10-08 MED ORDER — MIDAZOLAM HCL 2 MG/2ML IJ SOLN
INTRAMUSCULAR | Status: AC
  Filled 2016-10-08: qty 2

## 2016-10-08 MED ORDER — LIDOCAINE HCL (PF) 0.5 % IJ SOLN
INTRAMUSCULAR | Status: DC | PRN
  Administered 2016-10-08: 50 mL via INTRAVENOUS

## 2016-10-08 MED ORDER — MIDAZOLAM HCL 2 MG/2ML IJ SOLN
1.0000 mg | INTRAMUSCULAR | Status: DC | PRN
  Administered 2016-10-08: 2 mg via INTRAVENOUS

## 2016-10-08 MED ORDER — ONDANSETRON HCL 4 MG/2ML IJ SOLN
INTRAMUSCULAR | Status: DC | PRN
  Administered 2016-10-08: 4 mg via INTRAVENOUS

## 2016-10-08 MED ORDER — PROPOFOL 500 MG/50ML IV EMUL
INTRAVENOUS | Status: DC | PRN
  Administered 2016-10-08: 100 ug/kg/min via INTRAVENOUS

## 2016-10-08 MED ORDER — LACTATED RINGERS IV SOLN
INTRAVENOUS | Status: DC
  Administered 2016-10-08: 09:00:00 via INTRAVENOUS

## 2016-10-08 MED ORDER — LIDOCAINE HCL (CARDIAC) 20 MG/ML IV SOLN
INTRAVENOUS | Status: DC | PRN
  Administered 2016-10-08: 30 mg via INTRAVENOUS

## 2016-10-08 MED ORDER — BUPIVACAINE HCL (PF) 0.25 % IJ SOLN
INTRAMUSCULAR | Status: DC | PRN
  Administered 2016-10-08: 11 mL

## 2016-10-08 MED ORDER — CHLORHEXIDINE GLUCONATE 4 % EX LIQD: 60.0000 mL | Freq: Once | CUTANEOUS | Status: DC

## 2016-10-08 MED ORDER — VANCOMYCIN HCL IN DEXTROSE 1-5 GM/200ML-% IV SOLN
1000.0000 mg | INTRAVENOUS | Status: AC
  Administered 2016-10-08: 1000 mg via INTRAVENOUS

## 2016-10-08 MED ORDER — FENTANYL CITRATE (PF) 100 MCG/2ML IJ SOLN
INTRAMUSCULAR | Status: AC
  Filled 2016-10-08: qty 2

## 2016-10-08 MED ORDER — HYDROCODONE-ACETAMINOPHEN 5-325 MG PO TABS: 1.0000 | ORAL_TABLET | Freq: Four times a day (QID) | ORAL | 0 refills | Status: DC | PRN

## 2016-10-08 SURGICAL SUPPLY — 34 items
BANDAGE COBAN STERILE 2 (GAUZE/BANDAGES/DRESSINGS) ×2 IMPLANT
BLADE SURG 15 STRL LF DISP TIS (BLADE) ×1 IMPLANT
BLADE SURG 15 STRL SS (BLADE) ×1
BNDG ESMARK 4X9 LF (GAUZE/BANDAGES/DRESSINGS) ×2 IMPLANT
CHLORAPREP W/TINT 26ML (MISCELLANEOUS) ×2 IMPLANT
CORDS BIPOLAR (ELECTRODE) IMPLANT
COVER BACK TABLE 60X90IN (DRAPES) ×2 IMPLANT
COVER MAYO STAND STRL (DRAPES) ×2 IMPLANT
CUFF TOURNIQUET SINGLE 18IN (TOURNIQUET CUFF) IMPLANT
DECANTER SPIKE VIAL GLASS SM (MISCELLANEOUS) ×2 IMPLANT
DRAPE EXTREMITY T 121X128X90 (DRAPE) ×2 IMPLANT
DRAPE SURG 17X23 STRL (DRAPES) ×2 IMPLANT
GAUZE SPONGE 4X4 12PLY STRL (GAUZE/BANDAGES/DRESSINGS) ×2 IMPLANT
GAUZE XEROFORM 1X8 LF (GAUZE/BANDAGES/DRESSINGS) ×2 IMPLANT
GLOVE BIOGEL PI IND STRL 7.0 (GLOVE) ×3 IMPLANT
GLOVE BIOGEL PI IND STRL 8.5 (GLOVE) ×1 IMPLANT
GLOVE BIOGEL PI INDICATOR 7.0 (GLOVE) ×3
GLOVE BIOGEL PI INDICATOR 8.5 (GLOVE) ×1
GLOVE ECLIPSE 6.5 STRL STRAW (GLOVE) ×2 IMPLANT
GLOVE SURG ORTHO 8.0 STRL STRW (GLOVE) ×2 IMPLANT
GLOVE SURG SS PI 6.5 STRL IVOR (GLOVE) ×2 IMPLANT
GOWN STRL REUS W/ TWL LRG LVL3 (GOWN DISPOSABLE) ×1 IMPLANT
GOWN STRL REUS W/ TWL XL LVL3 (GOWN DISPOSABLE) ×1 IMPLANT
GOWN STRL REUS W/TWL LRG LVL3 (GOWN DISPOSABLE) ×1
GOWN STRL REUS W/TWL XL LVL3 (GOWN DISPOSABLE) ×3 IMPLANT
NEEDLE PRECISIONGLIDE 27X1.5 (NEEDLE) ×2 IMPLANT
NS IRRIG 1000ML POUR BTL (IV SOLUTION) ×2 IMPLANT
PACK BASIN DAY SURGERY FS (CUSTOM PROCEDURE TRAY) ×2 IMPLANT
STOCKINETTE 4X48 STRL (DRAPES) ×2 IMPLANT
SUT ETHILON 4 0 PS 2 18 (SUTURE) ×2 IMPLANT
SYR BULB 3OZ (MISCELLANEOUS) ×2 IMPLANT
SYR CONTROL 10ML LL (SYRINGE) ×2 IMPLANT
TOWEL OR 17X24 6PK STRL BLUE (TOWEL DISPOSABLE) ×4 IMPLANT
UNDERPAD 30X30 (UNDERPADS AND DIAPERS) IMPLANT

## 2016-10-08 NOTE — Discharge Instructions (Addendum)

## 2016-10-08 NOTE — Anesthesia Preprocedure Evaluation (Signed)
Anesthesia Evaluation  Patient identified by MRN, date of birth, ID band Patient awake    Reviewed: Allergy & Precautions, NPO status , Patient's Chart, lab work & pertinent test results  Airway Mallampati: I       Dental no notable dental hx.    Pulmonary neg pulmonary ROS,    breath sounds clear to auscultation       Cardiovascular hypertension,  Rhythm:Regular Rate:Normal     Neuro/Psych negative neurological ROS  negative psych ROS   GI/Hepatic negative GI ROS, Neg liver ROS,   Endo/Other  negative endocrine ROS  Renal/GU negative Renal ROS  negative genitourinary   Musculoskeletal negative musculoskeletal ROS (+)   Abdominal   Peds negative pediatric ROS (+)  Hematology negative hematology ROS (+)   Anesthesia Other Findings   Reproductive/Obstetrics negative OB ROS                             Anesthesia Physical Anesthesia Plan  ASA: II  Anesthesia Plan: Regional and Bier Block   Post-op Pain Management:    Induction: Intravenous  PONV Risk Score and Plan: 2 and Ondansetron, Dexamethasone and Treatment may vary due to age  Airway Management Planned:   Additional Equipment:   Intra-op Plan:   Post-operative Plan:   Informed Consent: I have reviewed the patients History and Physical, chart, labs and discussed the procedure including the risks, benefits and alternatives for the proposed anesthesia with the patient or authorized representative who has indicated his/her understanding and acceptance.     Plan Discussed with: CRNA  Anesthesia Plan Comments:         Anesthesia Quick Evaluation

## 2016-10-08 NOTE — Brief Op Note (Signed)
10/08/2016  10:10 AM  PATIENT:  John [name]  71 y.o. male  PRE-OPERATIVE DIAGNOSIS:  trigger Right Thumb, Index, Middle, Ring  POST-OPERATIVE DIAGNOSIS:  trigger Right Thumb, Index, Middle, Ring  PROCEDURE:  Procedure(s): RELEASE TRIGGER FINGER/A-1 PULLEY RIGHT THUMB, INDEX, MIDDLE AND RING (Right)  SURGEON:  Surgeon(s) and Role:    Daryll Brod, MD - Primary  PHYSICIAN ASSISTANT:   ASSISTANTS: none   ANESTHESIA:   local, regional and IV sedation  EBL:  No intake/output data recorded.  BLOOD ADMINISTERED:none  DRAINS: none   LOCAL MEDICATIONS USED:  BUPIVICAINE   SPECIMEN:  No Specimen  DISPOSITION OF SPECIMEN:  N/A  COUNTS:  YES  TOURNIQUET:  * Missing tourniquet times found for documented tourniquets in log:  [id number] * Total Tourniquet Time Documented: Upper Arm (Right) - 1 minutes Total: Upper Arm (Right) - 1 minutes   DICTATION: .Other Dictation: Dictation Number [id number]  [name] OF CARE: Discharge to home after PACU  PATIENT DISPOSITION:  PACU - hemodynamically stable.

## 2016-10-08 NOTE — Anesthesia Procedure Notes (Signed)
Procedure Name: MAC Date/Time: 10/08/2016 9:47 AM Performed by: Adalid Beckmann D Pre-anesthesia Checklist: Patient identified, Emergency Drugs available, Suction available, Patient being monitored and Timeout performed Patient Re-evaluated:Patient Re-evaluated prior to inductionOxygen Delivery Method: Simple face mask

## 2016-10-08 NOTE — Op Note (Signed)
Dictation Number [id number]

## 2016-10-08 NOTE — Anesthesia Postprocedure Evaluation (Signed)
Anesthesia Post Note  Patient: John [name]  Procedure(s) Performed: Procedure(s) (LRB): RELEASE TRIGGER FINGER/A-1 PULLEY RIGHT THUMB, INDEX, MIDDLE AND RING (Right)     Patient location during evaluation: PACU Anesthesia Type: Regional and MAC Level of consciousness: awake and alert Pain management: pain level controlled Vital Signs Assessment: post-procedure vital signs reviewed and stable Respiratory status: spontaneous breathing, nonlabored ventilation, respiratory function stable and patient connected to nasal cannula oxygen Cardiovascular status: stable and blood pressure returned to baseline Anesthetic complications: no    Last Vitals:  Vitals:   10/08/16 1030 10/08/16 1038  BP: 132/90   Pulse: 84 80  Resp: (!) 27 12  Temp:      Last Pain:  Vitals:   10/08/16 1037  TempSrc:   PainSc: 0-No pain                 Sheriden Archibeque,JAMES TERRILL

## 2016-10-08 NOTE — Transfer of Care (Signed)
Immediate Anesthesia Transfer of Care Note  Patient: John [name]  Procedure(s) Performed: Procedure(s): RELEASE TRIGGER FINGER/A-1 PULLEY RIGHT THUMB, INDEX, MIDDLE AND RING (Right)  Patient Location: PACU  Anesthesia Type:Bier block  Level of Consciousness: awake, alert , oriented and patient cooperative  Airway & Oxygen Therapy: Patient Spontanous Breathing and Patient connected to face mask oxygen  Post-op Assessment: Report given to RN and Post -op Vital signs reviewed and stable  Post vital signs: Reviewed and stable  Last Vitals:  Vitals:   10/08/16 0828  BP: (!) 144/90  Pulse: 82  Resp: 18  Temp: 36.7 C    Last Pain:  Vitals:   10/08/16 0828  TempSrc: Oral         Complications: No apparent anesthesia complications

## 2016-10-08 NOTE — H&P (Signed)
John [name] is an 71 y.o. male.   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 He comes in with a complaint of the inability to fully extend his PIP joint of his right middle finger. This been going on for approximately 1 week. He has been doing a lot of writing and that he is writing a book. He thinks that this is the underlying cause. He complains of pain only with straightening of the PIP joint with a aching type pain of 2/10. Otherwise he is not having any pain. He has no history of injury. He has not tried taking anything for this. He has no history diabetes thyroid problems arthritis gout. Family history is positive arthritis negative for diabetes thyroid problems and gout.This been injected on 2 occasions. He states that he is now having triggering of his index and thumb on that side. He complains of the inability to extend his thumb at the IP joint.           Past Medical History:  Diagnosis Date  . ACTINIC KERATOSIS, EAR, LEFT 09/21/2008  . ALLERGIC RHINITIS 09/21/2008  . Allergy   . Anxiety   . 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 CHOLESTEATOMA 02/07/2009  . COLONIC POLYPS, HX OF 09/21/2008  . DEPRESSION 09/21/2008  . ELEVATED BP READING WITHOUT DX HYPERTENSION 09/21/2008  . HYPERLIPIDEMIA 09/21/2008  . SEIZURE DISORDER 09/21/2008   Pt stated last seizure 1968, only happened twice.  . TRANSAMINASES, SERUM, ELEVATED 03/02/2010    Past Surgical History:  Procedure Laterality Date  . APPENDECTOMY    . HERNIA REPAIR    . TONSILLECTOMY      Family History  Problem Relation Age of Onset  . Hypertension Mother   . Hyperlipidemia Mother   . Stroke Mother   . Colon cancer Mother   . Colon cancer Unknown    Social History:  reports that he has never smoked. He has never used smokeless tobacco. He reports that he drinks about 8.4 oz of alcohol per week . He reports that he does not use  drugs.  Allergies:  Allergies  Allergen Reactions  . Penicillins     REACTION: rash    No prescriptions prior to admission.    No results found for this or any previous visit (from the past 48 hour(s)).  No results found.   Pertinent items are noted in HPI.  Height 5\' 9"  (1.753 m), weight 70.3 kg (155 lb).  General appearance: alert, cooperative and appears stated age Head: Normocephalic, without obvious abnormality Neck: no JVD Resp: clear to auscultation bilaterally Cardio: regular rate and rhythm, S1, S2 normal, no murmur, click, rub or gallop GI: soft, non-tender; bowel sounds normal; no masses,  no organomegaly Extremities: catching right thumb, index,middle and ring fingers Pulses: 2+ and symmetric Skin: Skin color, texture, turgor normal. No rashes or lesions Neurologic: Grossly normal Incision/Wound: na  Assessment/Plan Assessment:  1. Trigger thumb of right hand  2. Trigger index finger of right hand  3. Trigger middle finger of right hand  4. Trigger ring finger of right hand    Plan: We have discussed possibility surgical release of all 4 digits. He is advised that there is no guarantee to the surgery the possibility of infection recurrence injury to arteries nerves tendons complete relief symptoms dystrophy. He is scheduled for release A1 pulley right thumb index middle and ring fingers and outpatient under regional anesthesia. Pre-peri-and postoperative course are  discussed along with risk and complications. Questions are encouraged and answered to his satisfaction. He would like to proceed.      Letti Towell R 10/08/2016, 4:26 AM

## 2016-10-08 NOTE — Op Note (Signed)
NAMEMarland Kitchen  John, [name] NO.:  [id number]  MEDICAL RECORD NO.:  [id number]  LOCATION:                                 FACILITY:  PHYSICIAN:  Daryll Brod, M.D.            DATE OF BIRTH:  DATE OF PROCEDURE:  10/08/2016 DATE OF DISCHARGE:                              OPERATIVE REPORT   PREOPERATIVE DIAGNOSIS:  Stenosing tenosynovitis, right thumb, index, middle, and ring fingers.  POSTOPERATIVE DIAGNOSIS:  Stenosing tenosynovitis, right thumb, index, middle, and ring fingers.  OPERATION:  Release of A1 pulley, right thumb, right index, right middle, right ring fingers.  SURGEON:  Daryll Brod, M.D.  ASSISTANT:  None.  ANESTHESIA:  Upper arm IV regional.  PLACE OF SURGERY:  [facility].  ANESTHESIOLOGIST:  Ala Dach, M.D.  HISTORY:  The patient is a 71 year old male with a history of triggering of his index finger and a locked thumb, this has not responded to conservative treatment.  He has developed triggering of his middle and small fingers.  He is desirous to proceeding to have these surgically released.  Preoperative, perioperative, and postoperative courses are discussed along with risks and complications.  He is aware that there is no guarantee to the surgery; the possibility of infection; recurrence of injury to arteries, nerves, tendons; incomplete relief of symptoms; and dystrophy.  In preoperative area, the patient was seen, the extremity marked by both patient and surgeon.  Antibiotic given.  PROCEDURE IN DETAIL:  The patient was brought to the operating room, where upper arm IV regional anesthetic was carried out without difficulty.  He was prepped using ChloraPrep in supine position with the right arm free.  A 3-minute dry time was allowed, and a time-out taken, confirming the patient and procedure.  The thumb was addressed first with a transverse incision at the metacarpophalangeal joint carried down through subcutaneous  tissue.  Neurovascular structures were identified and protected.  The A1 pulley was found to be markedly thickened with a large nodule present just proximal to the flexor pollicis longus tendon. The A1 pulley was released on its radial aspect.  The tenosynovial tissue proximally was separated with blunt dissection.  The finger placed through a full range motion, no further triggering was noted. The wound was irrigated and closed with interrupted 4-0 nylon sutures. Separate incision was then made on the index finger obliquely at the A1 pulley, carried down through subcutaneous tissue.  Bleeders again electrocauterized with bipolar as necessary.  The neurovascular structures identified.  An incision was then made on the radial aspect of the A1 pulley, and a small incision made centrally in A2.  The tenosynovium proximally was separated along with the 2 tendons to be certain there was no adherence.  The finger placed through a full passive range of motion and no further triggering noted.  Separate incision was then made on the middle finger obliquely, carried down through subcutaneous tissues to the index.  The dissection identified the A1 pulley which was again thickened, this was released on its radial aspect where the small incision made centrally in A2.  The 2 tendons were then separated to break  any adhesions.  The finger placed through a full range motion, no further triggering was noted.  The ring finger was attended to next, again an oblique incision made, carried down through subcutaneous tissue.  Neurovascular structures protected with retractors.  The A1 pulley was released on its radial aspect.  A small incision made centrally in A2.  The tenosynovial tissue was separated with separation of the 2 tendons preventing adhesions.  The finger placed through a full passive range of motion, no further triggering was noted.  Each of these wounds was irrigated with saline and closed  with interrupted 4-0 nylon sutures.  A sterile compressive dressing with the fingers free was applied.  This was done after local infiltration with 0.25% bupivacaine without epinephrine, 11 mL was used total.  On deflation of the tourniquet, all fingers immediately pinked.  He was taken to the recovery room for observation in satisfactory condition. He will be discharged home to return to the [facility] in 1 week, on Norco.          ______________________________ Daryll Brod, M.D.     GK/MEDQ  D:  10/08/2016  T:  10/08/2016  Job:  [id number]

## 2016-10-08 NOTE — Anesthesia Procedure Notes (Signed)
Anesthesia Regional Block: Bier block (IV Regional)   Pre-Anesthetic Checklist: ,, timeout performed, Correct Patient, Correct Site, Correct Laterality, Correct Procedure,, site marked, surgical consent,, at surgeon's request  Laterality: Left     Needles:  Injection technique: Single-shot  Needle Type: Other      Needle Gauge: 22     Additional Needles:   Procedures:,,,,,,, Esmarch exsanguination, double tourniquet utilized  Narrative:   Performed by: Personally

## 2016-10-09 ENCOUNTER — Encounter (HOSPITAL_BASED_OUTPATIENT_CLINIC_OR_DEPARTMENT_OTHER): Payer: Self-pay | Admitting: Orthopedic Surgery

## 2016-10-29 DIAGNOSIS — M6530 Trigger finger, unspecified finger: Secondary | ICD-10-CM | POA: Diagnosis not present

## 2016-10-29 DIAGNOSIS — R29898 Other symptoms and signs involving the musculoskeletal system: Secondary | ICD-10-CM | POA: Diagnosis not present

## 2016-10-29 DIAGNOSIS — M65341 Trigger finger, right ring finger: Secondary | ICD-10-CM | POA: Diagnosis not present

## 2016-10-29 DIAGNOSIS — M65321 Trigger finger, right index finger: Secondary | ICD-10-CM | POA: Diagnosis not present

## 2016-10-29 DIAGNOSIS — M25649 Stiffness of unspecified hand, not elsewhere classified: Secondary | ICD-10-CM | POA: Diagnosis not present

## 2016-10-29 DIAGNOSIS — M65311 Trigger thumb, right thumb: Secondary | ICD-10-CM | POA: Diagnosis not present

## 2016-10-29 DIAGNOSIS — M65331 Trigger finger, right middle finger: Secondary | ICD-10-CM | POA: Diagnosis not present

## 2016-10-31 DIAGNOSIS — M25649 Stiffness of unspecified hand, not elsewhere classified: Secondary | ICD-10-CM | POA: Diagnosis not present

## 2016-10-31 DIAGNOSIS — M6530 Trigger finger, unspecified finger: Secondary | ICD-10-CM | POA: Diagnosis not present

## 2016-10-31 DIAGNOSIS — R29898 Other symptoms and signs involving the musculoskeletal system: Secondary | ICD-10-CM | POA: Diagnosis not present

## 2016-10-31 DIAGNOSIS — M65331 Trigger finger, right middle finger: Secondary | ICD-10-CM | POA: Diagnosis not present

## 2016-10-31 DIAGNOSIS — M65341 Trigger finger, right ring finger: Secondary | ICD-10-CM | POA: Diagnosis not present

## 2016-10-31 DIAGNOSIS — M65311 Trigger thumb, right thumb: Secondary | ICD-10-CM | POA: Diagnosis not present

## 2016-10-31 DIAGNOSIS — M65321 Trigger finger, right index finger: Secondary | ICD-10-CM | POA: Diagnosis not present

## 2016-11-04 DIAGNOSIS — M25649 Stiffness of unspecified hand, not elsewhere classified: Secondary | ICD-10-CM | POA: Diagnosis not present

## 2016-12-02 DIAGNOSIS — M25649 Stiffness of unspecified hand, not elsewhere classified: Secondary | ICD-10-CM | POA: Diagnosis not present

## 2016-12-02 DIAGNOSIS — M65311 Trigger thumb, right thumb: Secondary | ICD-10-CM | POA: Diagnosis not present

## 2016-12-09 DIAGNOSIS — H33312 Horseshoe tear of retina without detachment, left eye: Secondary | ICD-10-CM | POA: Diagnosis not present

## 2016-12-09 DIAGNOSIS — R29898 Other symptoms and signs involving the musculoskeletal system: Secondary | ICD-10-CM | POA: Diagnosis not present

## 2016-12-09 DIAGNOSIS — M25649 Stiffness of unspecified hand, not elsewhere classified: Secondary | ICD-10-CM | POA: Diagnosis not present

## 2016-12-09 DIAGNOSIS — H35413 Lattice degeneration of retina, bilateral: Secondary | ICD-10-CM | POA: Diagnosis not present

## 2016-12-09 DIAGNOSIS — H43813 Vitreous degeneration, bilateral: Secondary | ICD-10-CM | POA: Diagnosis not present

## 2016-12-09 DIAGNOSIS — H31009 Unspecified chorioretinal scars, unspecified eye: Secondary | ICD-10-CM | POA: Diagnosis not present

## 2016-12-09 DIAGNOSIS — D3132 Benign neoplasm of left choroid: Secondary | ICD-10-CM | POA: Diagnosis not present

## 2016-12-16 DIAGNOSIS — R29898 Other symptoms and signs involving the musculoskeletal system: Secondary | ICD-10-CM | POA: Diagnosis not present

## 2016-12-16 DIAGNOSIS — M25649 Stiffness of unspecified hand, not elsewhere classified: Secondary | ICD-10-CM | POA: Diagnosis not present

## 2016-12-25 DIAGNOSIS — R69 Illness, unspecified: Secondary | ICD-10-CM | POA: Diagnosis not present

## 2017-02-01 DIAGNOSIS — Z23 Encounter for immunization: Secondary | ICD-10-CM | POA: Diagnosis not present

## 2017-05-16 DIAGNOSIS — H25813 Combined forms of age-related cataract, bilateral: Secondary | ICD-10-CM | POA: Diagnosis not present

## 2017-05-16 DIAGNOSIS — H52203 Unspecified astigmatism, bilateral: Secondary | ICD-10-CM | POA: Diagnosis not present

## 2017-05-16 DIAGNOSIS — H31002 Unspecified chorioretinal scars, left eye: Secondary | ICD-10-CM | POA: Diagnosis not present

## 2017-05-16 DIAGNOSIS — H43813 Vitreous degeneration, bilateral: Secondary | ICD-10-CM | POA: Diagnosis not present

## 2017-05-28 DIAGNOSIS — D225 Melanocytic nevi of trunk: Secondary | ICD-10-CM | POA: Diagnosis not present

## 2017-05-28 DIAGNOSIS — Z23 Encounter for immunization: Secondary | ICD-10-CM | POA: Diagnosis not present

## 2017-05-28 DIAGNOSIS — D1801 Hemangioma of skin and subcutaneous tissue: Secondary | ICD-10-CM | POA: Diagnosis not present

## 2017-05-28 DIAGNOSIS — L821 Other seborrheic keratosis: Secondary | ICD-10-CM | POA: Diagnosis not present

## 2017-05-28 DIAGNOSIS — L814 Other melanin hyperpigmentation: Secondary | ICD-10-CM | POA: Diagnosis not present

## 2017-06-17 DIAGNOSIS — H25812 Combined forms of age-related cataract, left eye: Secondary | ICD-10-CM | POA: Diagnosis not present

## 2017-06-17 DIAGNOSIS — H52202 Unspecified astigmatism, left eye: Secondary | ICD-10-CM | POA: Diagnosis not present

## 2017-06-17 DIAGNOSIS — H268 Other specified cataract: Secondary | ICD-10-CM | POA: Diagnosis not present

## 2017-06-20 DIAGNOSIS — H16121 Filamentary keratitis, right eye: Secondary | ICD-10-CM | POA: Diagnosis not present

## 2017-06-20 DIAGNOSIS — H16101 Unspecified superficial keratitis, right eye: Secondary | ICD-10-CM | POA: Diagnosis not present

## 2017-06-23 DIAGNOSIS — M24541 Contracture, right hand: Secondary | ICD-10-CM | POA: Diagnosis not present

## 2017-06-24 DIAGNOSIS — H268 Other specified cataract: Secondary | ICD-10-CM | POA: Diagnosis not present

## 2017-06-24 DIAGNOSIS — H5210 Myopia, unspecified eye: Secondary | ICD-10-CM | POA: Diagnosis not present

## 2017-06-24 DIAGNOSIS — H25811 Combined forms of age-related cataract, right eye: Secondary | ICD-10-CM | POA: Diagnosis not present

## 2017-07-09 DIAGNOSIS — R69 Illness, unspecified: Secondary | ICD-10-CM | POA: Diagnosis not present

## 2017-09-08 DIAGNOSIS — D3132 Benign neoplasm of left choroid: Secondary | ICD-10-CM | POA: Diagnosis not present

## 2017-09-08 DIAGNOSIS — H33321 Round hole, right eye: Secondary | ICD-10-CM | POA: Diagnosis not present

## 2017-09-08 DIAGNOSIS — H35363 Drusen (degenerative) of macula, bilateral: Secondary | ICD-10-CM | POA: Diagnosis not present

## 2017-09-08 DIAGNOSIS — H35413 Lattice degeneration of retina, bilateral: Secondary | ICD-10-CM | POA: Diagnosis not present

## 2017-09-08 DIAGNOSIS — H31009 Unspecified chorioretinal scars, unspecified eye: Secondary | ICD-10-CM | POA: Diagnosis not present

## 2017-09-15 DIAGNOSIS — H33321 Round hole, right eye: Secondary | ICD-10-CM | POA: Diagnosis not present

## 2017-09-30 DIAGNOSIS — Z125 Encounter for screening for malignant neoplasm of prostate: Secondary | ICD-10-CM | POA: Diagnosis not present

## 2017-09-30 DIAGNOSIS — E7849 Other hyperlipidemia: Secondary | ICD-10-CM | POA: Diagnosis not present

## 2017-09-30 DIAGNOSIS — R82998 Other abnormal findings in urine: Secondary | ICD-10-CM | POA: Diagnosis not present

## 2017-10-06 DIAGNOSIS — M6530 Trigger finger, unspecified finger: Secondary | ICD-10-CM | POA: Diagnosis not present

## 2017-10-06 DIAGNOSIS — Z0000 Encounter for general adult medical examination without abnormal findings: Secondary | ICD-10-CM | POA: Diagnosis not present

## 2017-10-06 DIAGNOSIS — R69 Illness, unspecified: Secondary | ICD-10-CM | POA: Diagnosis not present

## 2017-10-06 DIAGNOSIS — H3530 Unspecified macular degeneration: Secondary | ICD-10-CM | POA: Diagnosis not present

## 2017-10-06 DIAGNOSIS — R030 Elevated blood-pressure reading, without diagnosis of hypertension: Secondary | ICD-10-CM | POA: Diagnosis not present

## 2017-10-06 DIAGNOSIS — E7849 Other hyperlipidemia: Secondary | ICD-10-CM | POA: Diagnosis not present

## 2017-10-06 DIAGNOSIS — J3089 Other allergic rhinitis: Secondary | ICD-10-CM | POA: Diagnosis not present

## 2017-10-06 DIAGNOSIS — L821 Other seborrheic keratosis: Secondary | ICD-10-CM | POA: Diagnosis not present

## 2017-10-06 DIAGNOSIS — Z6824 Body mass index (BMI) 24.0-24.9, adult: Secondary | ICD-10-CM | POA: Diagnosis not present

## 2017-10-07 DIAGNOSIS — Z1212 Encounter for screening for malignant neoplasm of rectum: Secondary | ICD-10-CM | POA: Diagnosis not present

## 2017-10-08 ENCOUNTER — Other Ambulatory Visit: Payer: Self-pay | Admitting: Internal Medicine

## 2017-10-08 DIAGNOSIS — E785 Hyperlipidemia, unspecified: Secondary | ICD-10-CM

## 2017-11-03 ENCOUNTER — Ambulatory Visit
Admission: RE | Admit: 2017-11-03 | Discharge: 2017-11-03 | Disposition: A | Discharge status: Home / Self Care | Payer: Medicare HMO | Source: Ambulatory Visit | Attending: Internal Medicine | Admitting: Internal Medicine

## 2017-11-03 DIAGNOSIS — E785 Hyperlipidemia, unspecified: Secondary | ICD-10-CM

## 2017-11-03 DIAGNOSIS — Z23 Encounter for immunization: Secondary | ICD-10-CM | POA: Diagnosis not present

## 2017-12-04 DIAGNOSIS — E7849 Other hyperlipidemia: Secondary | ICD-10-CM | POA: Diagnosis not present

## 2018-01-19 DIAGNOSIS — H35413 Lattice degeneration of retina, bilateral: Secondary | ICD-10-CM | POA: Diagnosis not present

## 2018-01-19 DIAGNOSIS — H43813 Vitreous degeneration, bilateral: Secondary | ICD-10-CM | POA: Diagnosis not present

## 2018-01-19 DIAGNOSIS — H33321 Round hole, right eye: Secondary | ICD-10-CM | POA: Diagnosis not present

## 2018-01-21 DIAGNOSIS — R69 Illness, unspecified: Secondary | ICD-10-CM | POA: Diagnosis not present

## 2018-01-31 DIAGNOSIS — Z23 Encounter for immunization: Secondary | ICD-10-CM | POA: Diagnosis not present

## 2018-05-13 DIAGNOSIS — Z23 Encounter for immunization: Secondary | ICD-10-CM | POA: Diagnosis not present

## 2018-05-19 DIAGNOSIS — E7849 Other hyperlipidemia: Secondary | ICD-10-CM | POA: Diagnosis not present

## 2018-05-22 DIAGNOSIS — Z23 Encounter for immunization: Secondary | ICD-10-CM | POA: Diagnosis not present

## 2018-06-19 DIAGNOSIS — Z23 Encounter for immunization: Secondary | ICD-10-CM | POA: Diagnosis not present

## 2018-10-06 DIAGNOSIS — H33321 Round hole, right eye: Secondary | ICD-10-CM | POA: Diagnosis not present

## 2018-10-06 DIAGNOSIS — H33312 Horseshoe tear of retina without detachment, left eye: Secondary | ICD-10-CM | POA: Diagnosis not present

## 2018-10-06 DIAGNOSIS — D3132 Benign neoplasm of left choroid: Secondary | ICD-10-CM | POA: Diagnosis not present

## 2018-10-06 DIAGNOSIS — H35413 Lattice degeneration of retina, bilateral: Secondary | ICD-10-CM | POA: Diagnosis not present

## 2018-10-28 DIAGNOSIS — Z961 Presence of intraocular lens: Secondary | ICD-10-CM | POA: Diagnosis not present

## 2018-10-28 DIAGNOSIS — D23111 Other benign neoplasm of skin of right upper eyelid, including canthus: Secondary | ICD-10-CM | POA: Diagnosis not present

## 2018-10-28 DIAGNOSIS — H26491 Other secondary cataract, right eye: Secondary | ICD-10-CM | POA: Diagnosis not present

## 2019-02-05 DIAGNOSIS — Z23 Encounter for immunization: Secondary | ICD-10-CM | POA: Diagnosis not present

## 2019-02-08 DIAGNOSIS — Z125 Encounter for screening for malignant neoplasm of prostate: Secondary | ICD-10-CM | POA: Diagnosis not present

## 2019-02-08 DIAGNOSIS — E7849 Other hyperlipidemia: Secondary | ICD-10-CM | POA: Diagnosis not present

## 2019-02-09 DIAGNOSIS — R82998 Other abnormal findings in urine: Secondary | ICD-10-CM | POA: Diagnosis not present

## 2019-02-12 DIAGNOSIS — Z1212 Encounter for screening for malignant neoplasm of rectum: Secondary | ICD-10-CM | POA: Diagnosis not present

## 2019-02-15 DIAGNOSIS — E785 Hyperlipidemia, unspecified: Secondary | ICD-10-CM | POA: Diagnosis not present

## 2019-02-15 DIAGNOSIS — M6530 Trigger finger, unspecified finger: Secondary | ICD-10-CM | POA: Diagnosis not present

## 2019-02-15 DIAGNOSIS — L729 Follicular cyst of the skin and subcutaneous tissue, unspecified: Secondary | ICD-10-CM | POA: Diagnosis not present

## 2019-02-15 DIAGNOSIS — Z7189 Other specified counseling: Secondary | ICD-10-CM | POA: Diagnosis not present

## 2019-02-15 DIAGNOSIS — R030 Elevated blood-pressure reading, without diagnosis of hypertension: Secondary | ICD-10-CM | POA: Diagnosis not present

## 2019-02-15 DIAGNOSIS — H3530 Unspecified macular degeneration: Secondary | ICD-10-CM | POA: Diagnosis not present

## 2019-02-15 DIAGNOSIS — K649 Unspecified hemorrhoids: Secondary | ICD-10-CM | POA: Diagnosis not present

## 2019-02-15 DIAGNOSIS — R69 Illness, unspecified: Secondary | ICD-10-CM | POA: Diagnosis not present

## 2019-02-15 DIAGNOSIS — Z0000 Encounter for general adult medical examination without abnormal findings: Secondary | ICD-10-CM | POA: Diagnosis not present

## 2019-02-15 DIAGNOSIS — I2584 Coronary atherosclerosis due to calcified coronary lesion: Secondary | ICD-10-CM | POA: Diagnosis not present

## 2019-05-05 ENCOUNTER — Ambulatory Visit: Payer: Medicare HMO | Attending: Internal Medicine

## 2019-05-05 DIAGNOSIS — Z20822 Contact with and (suspected) exposure to covid-19: Secondary | ICD-10-CM

## 2019-05-06 LAB — NOVEL CORONAVIRUS, NAA: SARS-CoV-2, NAA: NOT DETECTED

## 2019-05-26 ENCOUNTER — Ambulatory Visit: Payer: Medicare Other | Attending: Internal Medicine

## 2019-05-26 DIAGNOSIS — Z23 Encounter for immunization: Secondary | ICD-10-CM | POA: Insufficient documentation

## 2019-05-26 NOTE — Progress Notes (Signed)
   Covid-19 Vaccination Clinic  Name:  John [name]    MRN: [id number] DOB: Sep 07, 1945  05/26/2019  John [name] was observed post Covid-19 immunization for 15 minutes without incidence. He was provided with Vaccine Information Sheet and instruction to access the V-Safe system.   John [name] was instructed to call 911 with any severe reactions post vaccine: Marland Kitchen Difficulty breathing  . Swelling of your face and throat  . A fast heartbeat  . A bad rash all over your body  . Dizziness and weakness    Immunizations Administered    Name Date Dose VIS Date Route   Pfizer COVID-19 Vaccine 05/26/2019  6:39 PM 0.3 mL 04/16/2019 Intramuscular   Manufacturer: [place]   Lot: [id number]   [place]: [id number]

## 2019-06-12 ENCOUNTER — Ambulatory Visit: Payer: Medicare HMO

## 2019-06-13 ENCOUNTER — Ambulatory Visit: Payer: Medicare HMO

## 2019-06-14 ENCOUNTER — Ambulatory Visit: Payer: Medicare HMO | Attending: Internal Medicine

## 2019-06-14 DIAGNOSIS — Z23 Encounter for immunization: Secondary | ICD-10-CM

## 2019-06-14 NOTE — Progress Notes (Signed)
   Covid-19 Vaccination Clinic  Name:  John [name]    MRN: [id number] DOB: February 19, 1946  06/14/2019  [name] was observed post Covid-19 immunization for 15 minutes without incidence. He was provided with Vaccine Information Sheet and instruction to access the V-Safe system.   [name] was instructed to call 911 with any severe reactions post vaccine: Marland Kitchen Difficulty breathing  . Swelling of your face and throat  . A fast heartbeat  . A bad rash all over your body  . Dizziness and weakness    Immunizations Administered    Name Date Dose VIS Date Route   Pfizer COVID-19 Vaccine 06/14/2019  9:16 AM 0.3 mL 04/16/2019 Intramuscular   Manufacturer: [place]   Lot: [id number]   [place]: [id number]

## 2019-06-29 DIAGNOSIS — L84 Corns and callosities: Secondary | ICD-10-CM | POA: Diagnosis not present

## 2019-06-29 DIAGNOSIS — L280 Lichen simplex chronicus: Secondary | ICD-10-CM | POA: Diagnosis not present

## 2019-08-23 DIAGNOSIS — H31002 Unspecified chorioretinal scars, left eye: Secondary | ICD-10-CM | POA: Diagnosis not present

## 2019-08-23 DIAGNOSIS — D3132 Benign neoplasm of left choroid: Secondary | ICD-10-CM | POA: Diagnosis not present

## 2019-08-23 DIAGNOSIS — Z961 Presence of intraocular lens: Secondary | ICD-10-CM | POA: Diagnosis not present

## 2019-08-23 DIAGNOSIS — H26493 Other secondary cataract, bilateral: Secondary | ICD-10-CM | POA: Diagnosis not present

## 2019-08-25 DIAGNOSIS — R69 Illness, unspecified: Secondary | ICD-10-CM | POA: Diagnosis not present

## 2019-12-11 DIAGNOSIS — Z20822 Contact with and (suspected) exposure to covid-19: Secondary | ICD-10-CM | POA: Diagnosis not present

## 2020-01-11 IMAGING — CT CT HEART SCORING
3 series · 13 of 20 positions shown, 15 images · non-contrast
Comparison: None.

CLINICAL DATA: Hyperlipidemia

EXAM:
CT HEART FOR CALCIUM SCORING
TECHNIQUE: CT heart was performed on a 64 channel system using prospective ECG
gating.
A non-contrast exam for calcium scoring was performed.
Note that this exam targets the heart and the chest was not imaged
in its entirety.

[Series 2: calcium scoring 2.00 qr36 bestdiast 71% · axial · 0.41mm/px · z∈[+1411,+1467]mm · 3 of 70 slices shown]
[im 14/70  vessel]
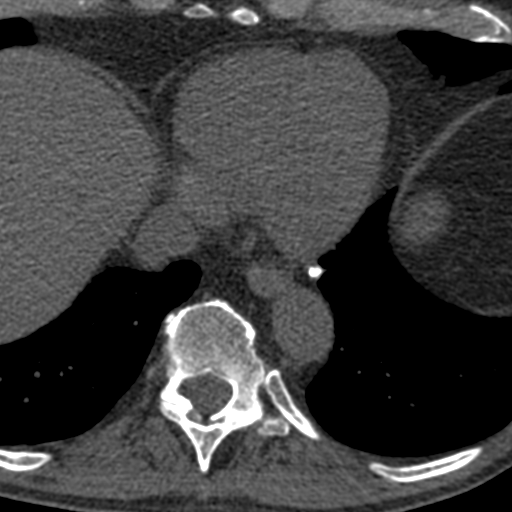
[im 28/70  vessel]
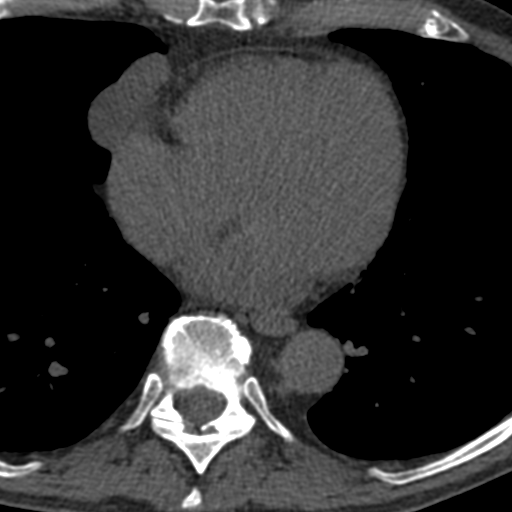
[im 42/70  vessel]
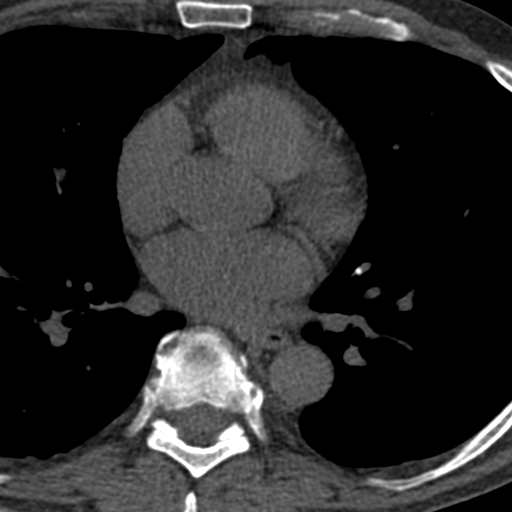

[Series 3: calcium scoring 2.00 br40 bestdiast 71% ax fov · axial · 0.53mm/px · z∈[+1407,+1499]mm · 5 of 70 slices shown, 7 images]
[im 12/70  vessel]
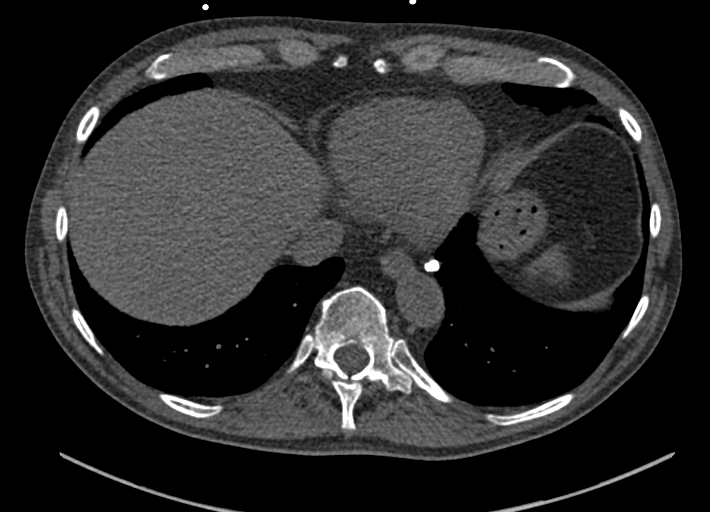
[im 12/70  lung]
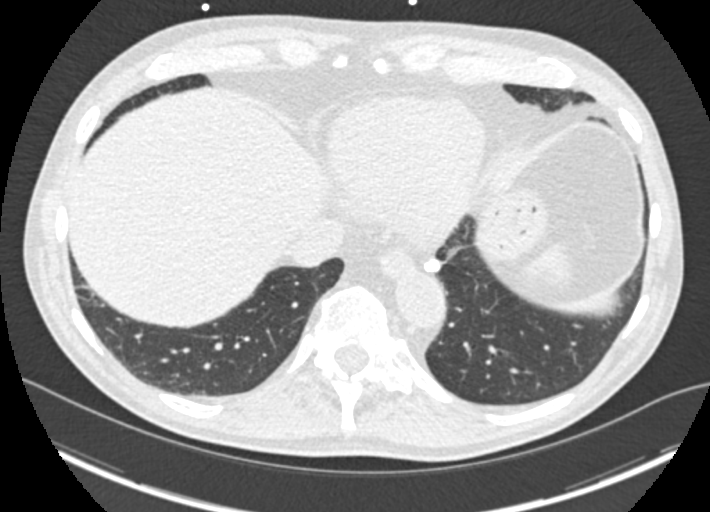
[im 24/70  vessel]
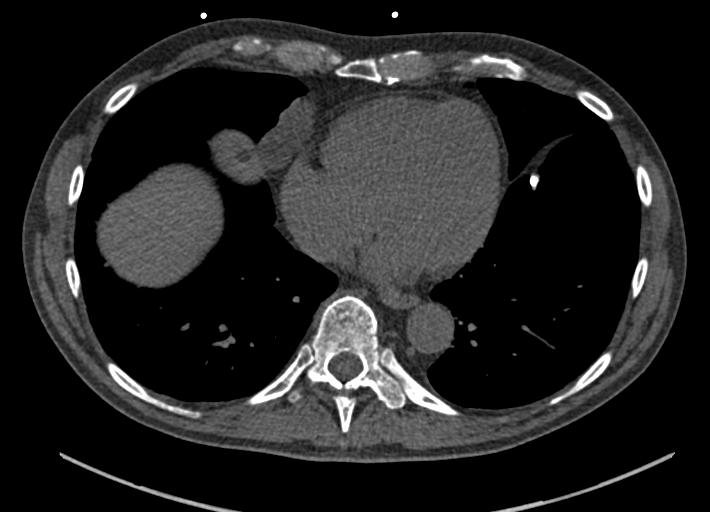
[im 35/70  vessel]
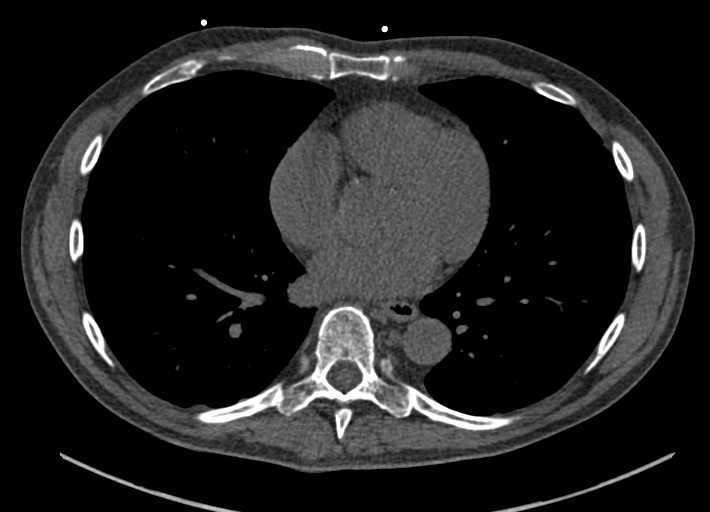
[im 47/70  vessel]
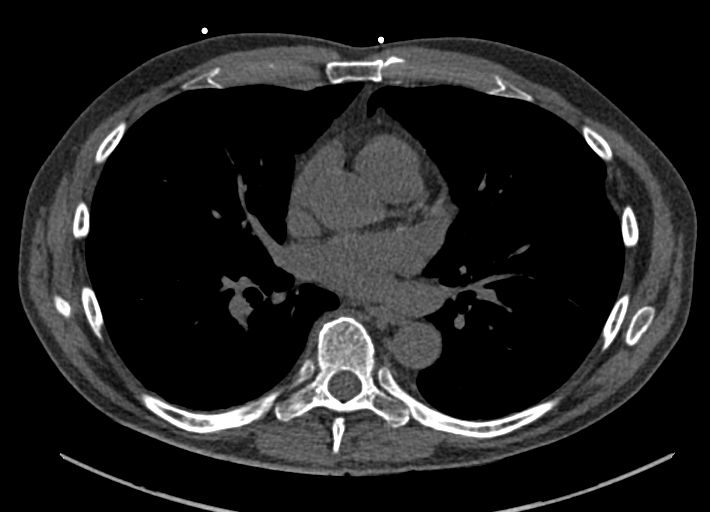
[im 58/70  vessel]
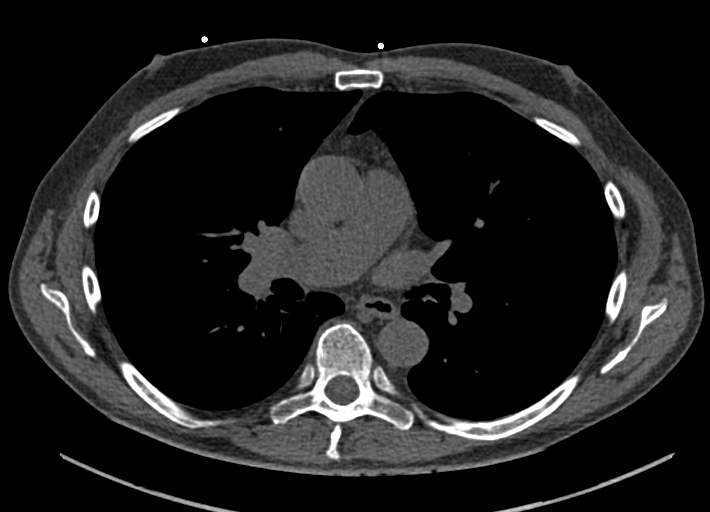
[im 58/70  lung]
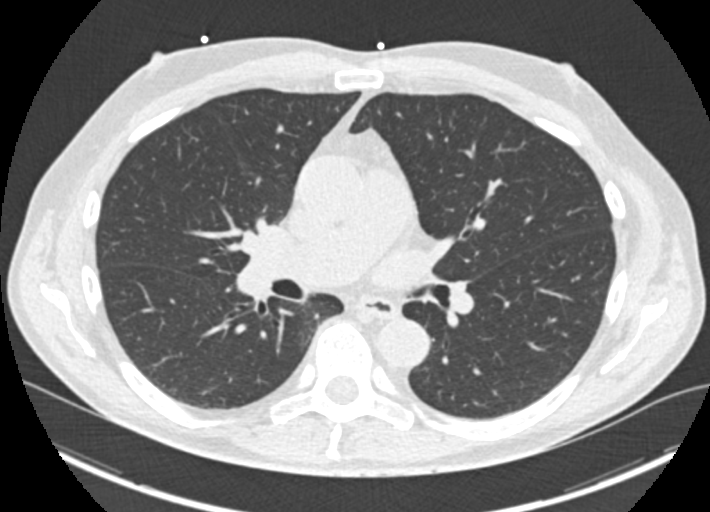

[Series 9: calcium scoring 2.00 br60 bestdiast 71% ax fov · axial · 0.53mm/px · z∈[+1407,+1499]mm · 5 of 70 slices shown]
[im 12/70  vessel]
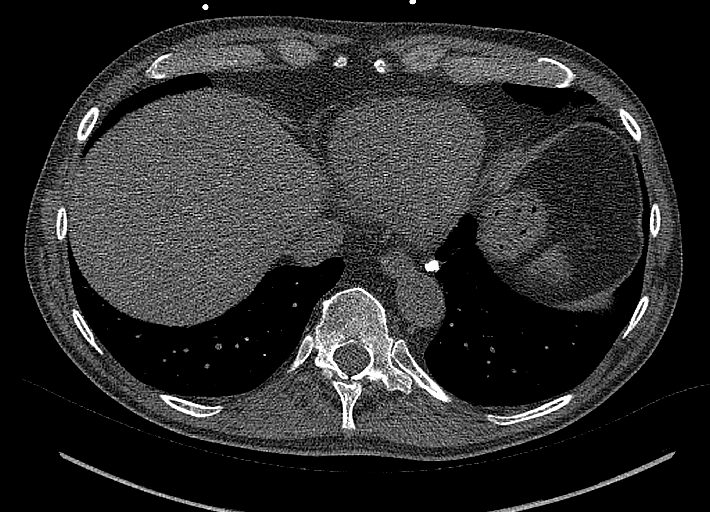
[im 24/70  vessel]
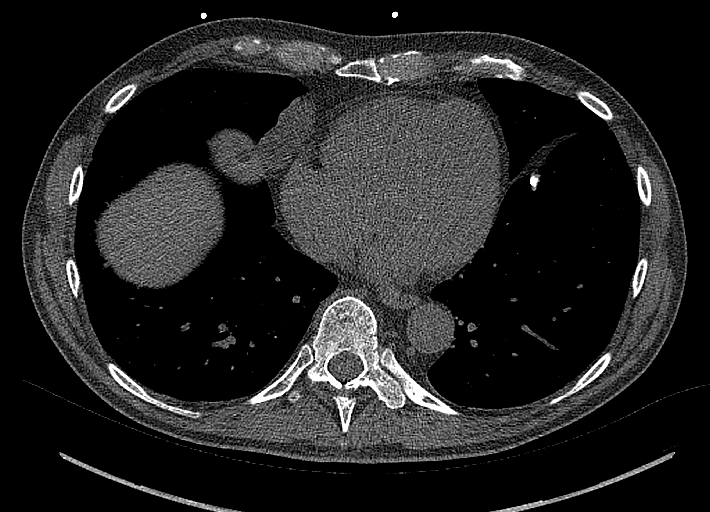
[im 35/70  vessel]
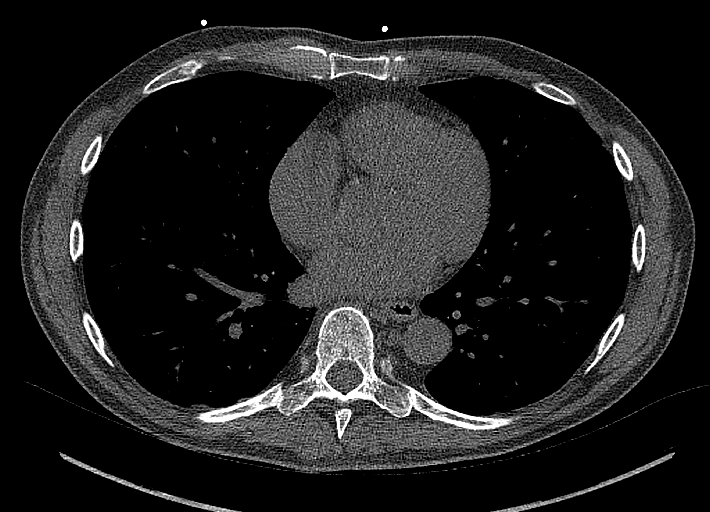
[im 47/70  vessel]
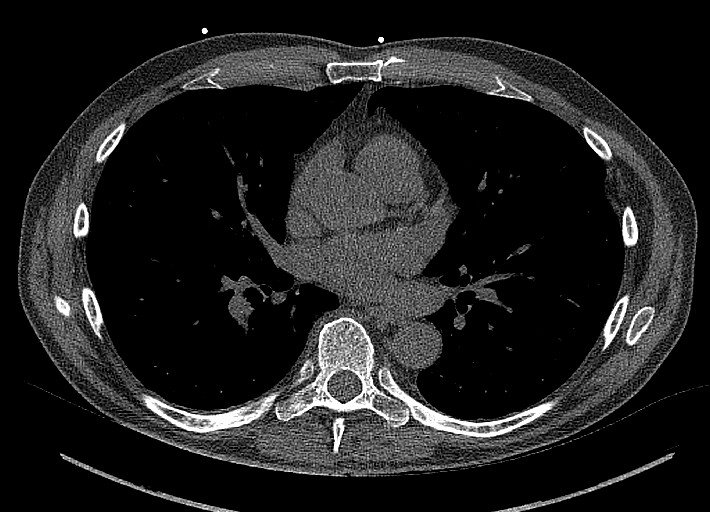
[im 58/70  vessel]
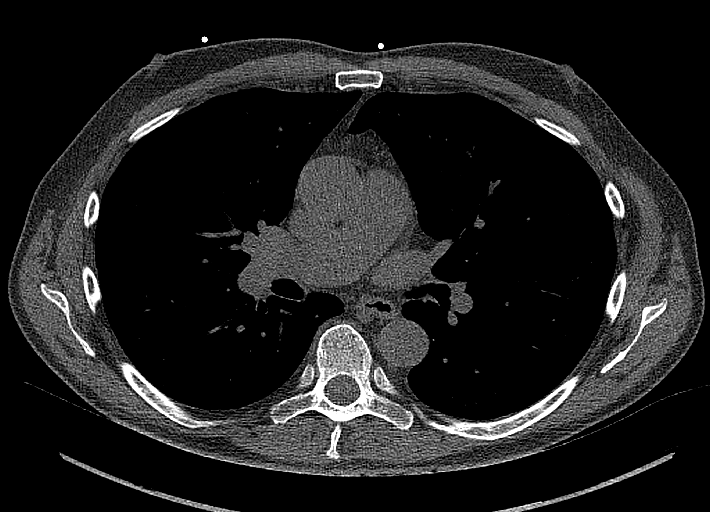

[13 of 20 positions shown; findings below may reference images not displayed]

FINDINGS: Technical quality: Good.

CORONARY CALCIUM

Total Agatston Score: 10.4 with calcifications in the left anterior
descending and left circumflex coronary arteries

[HOSPITAL] percentile:  22

OTHER FINDINGS:

Cardiovascular: Heart is normal size.  Aorta is normal caliber.

Mediastinum/Nodes: 4.4 x 2.2 cm ovoid cystic structure noted along
the right heart border, likely epicardial cyst. No adenopathy in the
lower mediastinum or hila.

Lungs/Pleura: Calcified granuloma at the left base in the left lower
lobe. No effusions. Lungs otherwise clear.

Upper Abdomen: Imaging into the upper abdomen shows no acute
findings.

Musculoskeletal: Chest wall soft tissues are unremarkable. No acute
bony abnormality.
IMPRESSION: The observed calcium score of 10.4 is at the percentile 22 for
subjects of the same age, gender and race/ethnicity who are free of
clinical cardiovascular disease and treated diabetes.

4.4 x 2.2 cm ovoid cystic structure along the right heart border,
likely epicardial cyst.

Old granulomatous disease.

## 2020-01-18 ENCOUNTER — Encounter (INDEPENDENT_AMBULATORY_CARE_PROVIDER_SITE_OTHER): Payer: Medicare HMO | Admitting: Ophthalmology

## 2020-02-07 ENCOUNTER — Other Ambulatory Visit: Payer: Self-pay

## 2020-02-07 ENCOUNTER — Ambulatory Visit (INDEPENDENT_AMBULATORY_CARE_PROVIDER_SITE_OTHER): Payer: Medicare HMO | Admitting: Ophthalmology

## 2020-02-07 ENCOUNTER — Encounter (INDEPENDENT_AMBULATORY_CARE_PROVIDER_SITE_OTHER): Payer: Self-pay | Admitting: Ophthalmology

## 2020-02-07 DIAGNOSIS — H35413 Lattice degeneration of retina, bilateral: Secondary | ICD-10-CM

## 2020-02-07 DIAGNOSIS — H35363 Drusen (degenerative) of macula, bilateral: Secondary | ICD-10-CM | POA: Diagnosis not present

## 2020-02-07 DIAGNOSIS — H31003 Unspecified chorioretinal scars, bilateral: Secondary | ICD-10-CM | POA: Diagnosis not present

## 2020-02-07 DIAGNOSIS — H31009 Unspecified chorioretinal scars, unspecified eye: Secondary | ICD-10-CM | POA: Insufficient documentation

## 2020-02-07 DIAGNOSIS — H33321 Round hole, right eye: Secondary | ICD-10-CM

## 2020-02-07 DIAGNOSIS — D3132 Benign neoplasm of left choroid: Secondary | ICD-10-CM

## 2020-02-07 NOTE — Progress Notes (Signed)
02/07/2020     CHIEF COMPLAINT Patient presents for Retina Follow Up   HISTORY OF PRESENT ILLNESS: John [name] is a 74 y.o. male who presents to the clinic today for:   HPI    Retina Follow Up    Patient presents with  Retinal Break/Detachment (Lattice Degeneration OU).  In right eye.  Severity is moderate.  Duration of 1 year.  Since onset it is stable.  I, the attending physician,  performed the HPI with the patient and updated documentation appropriately.          Comments    1 Year f\u OU. FP  Pt states no changes in vision. Denies FOL and floaters.       Last edited by Tilda Franco on 02/07/2020  9:42 AM. (History)      Referring physician: Shon Baton, MD [address] [place],  [place] [address]  HISTORICAL INFORMATION:   Selected notes from the [facility]: No current outpatient medications on file. (Ophthalmic Drugs)   No current facility-administered medications for this visit. (Ophthalmic Drugs)   Current Outpatient Medications (Other)  Medication Sig  . Ascorbic Acid (VITAMIN C) 500 MG tablet Take 500 mg by mouth daily.    . clonazePAM (KLONOPIN) 0.5 MG tablet take 1 tablet by mouth once daily (Patient taking differently: only takes as needed)  . CRESTOR 20 MG tablet TAKE 1 TABLET BY MOUTH DAILY.  . fish oil-omega-3 fatty acids 1000 MG capsule Take 2 g by mouth daily.    Marland Kitchen FLUoxetine (PROZAC) 20 MG capsule TAKE 1 CAPSULE BY MOUTH EVERY DAY  . HYDROcodone-acetaminophen (NORCO) 5-325 MG tablet Take 1 tablet by mouth every 6 (six) hours as needed.  . Multiple Vitamin (MULTIVITAMIN) tablet Take 1 tablet by mouth daily.    . Multiple Vitamins-Minerals (ICAPS MV PO) Take 1 capsule by mouth daily.   No current facility-administered medications for this visit. (Other)      REVIEW OF SYSTEMS:    ALLERGIES Allergies  Allergen Reactions  . Penicillins     REACTION: rash    PAST MEDICAL HISTORY Past  Medical History:  Diagnosis Date  . ACTINIC KERATOSIS, EAR, LEFT 09/21/2008  . ALLERGIC RHINITIS 09/21/2008  . Allergy   . Anxiety   . 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 CHOLESTEATOMA 02/07/2009  . COLONIC POLYPS, HX OF 09/21/2008  . DEPRESSION 09/21/2008  . ELEVATED BP READING WITHOUT DX HYPERTENSION 09/21/2008  . HYPERLIPIDEMIA 09/21/2008  . SEIZURE DISORDER 09/21/2008   Pt stated last seizure 1968, only happened twice.  . TRANSAMINASES, SERUM, ELEVATED 03/02/2010   Past Surgical History:  Procedure Laterality Date  . APPENDECTOMY    . HERNIA REPAIR    . TONSILLECTOMY    . TRIGGER FINGER RELEASE Right 10/08/2016   Procedure: RELEASE TRIGGER FINGER/A-1 PULLEY RIGHT THUMB, INDEX, MIDDLE AND RING;  Surgeon: Daryll Brod, MD;  Location: [facility];  Service: Orthopedics;  Laterality: Right;    FAMILY HISTORY Family History  Problem Relation Age of Onset  . Hypertension Mother   . Hyperlipidemia Mother   . Stroke Mother   . Colon cancer Mother   . Colon cancer Unknown     SOCIAL HISTORY Social History   Tobacco Use  . Smoking status: Never Smoker  . Smokeless tobacco: Never Used  Substance Use Topics  . Alcohol use: Yes    Alcohol/week: 14.0 standard drinks    Types: 14 Standard  drinks or equivalent per week    Comment: 1-2 glasses of wine per night  . Drug use: No         OPHTHALMIC EXAM:  Base Eye Exam    Visual Acuity (Snellen - Linear)      Right Left   Dist [place] 20/20 20/25       Tonometry (Tonopen, 9:45 AM)      Right Left   Pressure 14 15       Pupils      Pupils Dark Light Shape React APD   Right PERRL 4 3 Round Brisk None   Left PERRL 4 3 Round Brisk None       Visual Fields (Counting fingers)      Left Right    Full Full       Neuro/Psych    Oriented x3: Yes   Mood/Affect: Normal       Dilation    Both eyes: 1.0% Mydriacyl, 2.5% Phenylephrine @ 9:45 AM        Slit Lamp and Fundus Exam    External Exam      Right  Left   External Normal Normal       Slit Lamp Exam      Right Left   Lids/Lashes Normal Normal   Conjunctiva/Sclera White and quiet White and quiet   Cornea Clear Clear   Anterior Chamber Deep and quiet Deep and quiet   Iris Round and reactive Round and reactive   Lens Centered posterior chamber intraocular lens Centered posterior chamber intraocular lens   Anterior Vitreous Normal Normal       Fundus Exam      Right Left   Posterior Vitreous Posterior vitreous detachment Posterior vitreous detachment   Disc Normal Normal   C/D Ratio 0.35 0.35   Macula Normal Normal   Vessels Normal Normal   Periphery Lattice degeneration Small flat choroidal nevus 1.5 disc diameter inferonasal to the nerve, flat with no high risk features.  Superotemporal arcade there is also a flat nevus, no high risk features, no interval change over time in size          IMAGING AND PROCEDURES  Imaging and Procedures for 02/07/20  Color Fundus Photography Optos - OU - Both Eyes       Right Eye Progression has been stable. Disc findings include normal observations. Macula : normal observations. Vessels : normal observations. Periphery : normal observations.   Left Eye Progression has been stable. Disc findings include normal observations. Macula : normal observations. Vessels : normal observations. Periphery : normal observations.   Notes  Laser chorioretinal scar inferonasal to the nerve OS, with stable choroidal nevi compared to previous years and photo examination                ASSESSMENT/PLAN:  No problem-specific Assessment & Plan notes found for this encounter.      ICD-10-CM   1. 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 Drusen (degenerative) of retina, bilateral  H35.363     1.  No interval change over the years.  We will continue to observe.  2.  3.  Ophthalmic Meds Ordered  this visit:  No orders of the defined types were placed in this encounter.      Return in about 1 year (around 02/06/2021).  There are no Patient Instructions on file for this visit.   Explained the diagnoses, plan, and follow up with the patient and they expressed understanding.  Patient expressed understanding of the importance of proper follow up care.   Clent Demark Kalise Fickett M.D. Diseases & Surgery of the Retina and Vitreous [facility] 02/07/20     Abbreviations: M myopia (nearsighted); A astigmatism; H hyperopia (farsighted); P presbyopia; Mrx spectacle prescription;  CTL contact lenses; OD right eye; OS left eye; OU both eyes  XT exotropia; ET esotropia; PEK punctate epithelial keratitis; PEE punctate epithelial erosions; DES dry eye syndrome; MGD meibomian gland dysfunction; ATs artificial tears; PFAT's preservative free artificial tears; [place] nuclear sclerotic cataract; PSC posterior subcapsular cataract; ERM epi-retinal membrane; PVD posterior vitreous detachment; RD retinal detachment; DM diabetes mellitus; DR diabetic retinopathy; NPDR non-proliferative diabetic retinopathy; PDR proliferative diabetic retinopathy; CSME clinically significant macular edema; DME diabetic macular edema; dbh dot blot hemorrhages; CWS cotton wool spot; POAG primary open angle glaucoma; C/D cup-to-disc ratio; HVF humphrey visual field; GVF goldmann visual field; OCT optical coherence tomography; IOP intraocular pressure; BRVO Branch retinal vein occlusion; CRVO central retinal vein occlusion; CRAO central retinal artery occlusion; BRAO branch retinal artery occlusion; RT retinal tear; SB scleral buckle; PPV pars plana vitrectomy; VH Vitreous hemorrhage; PRP panretinal laser photocoagulation; IVK intravitreal kenalog;  VMT vitreomacular traction; MH Macular hole;  NVD neovascularization of the disc; NVE neovascularization elsewhere; AREDS age related eye disease study; ARMD age related macular degeneration; POAG primary open angle glaucoma; EBMD epithelial/anterior basement membrane dystrophy; ACIOL anterior chamber intraocular lens; IOL intraocular lens; PCIOL posterior chamber intraocular lens; Phaco/IOL phacoemulsification with intraocular lens placement; [place] photorefractive keratectomy; LASIK laser assisted in situ keratomileusis; HTN hypertension; DM diabetes mellitus; COPD chronic obstructive pulmonary disease

## 2020-02-09 DIAGNOSIS — Z23 Encounter for immunization: Secondary | ICD-10-CM | POA: Diagnosis not present

## 2020-02-10 DIAGNOSIS — E785 Hyperlipidemia, unspecified: Secondary | ICD-10-CM | POA: Diagnosis not present

## 2020-02-10 DIAGNOSIS — Z125 Encounter for screening for malignant neoplasm of prostate: Secondary | ICD-10-CM | POA: Diagnosis not present

## 2020-02-17 DIAGNOSIS — R030 Elevated blood-pressure reading, without diagnosis of hypertension: Secondary | ICD-10-CM | POA: Diagnosis not present

## 2020-02-17 DIAGNOSIS — J309 Allergic rhinitis, unspecified: Secondary | ICD-10-CM | POA: Diagnosis not present

## 2020-02-17 DIAGNOSIS — L729 Follicular cyst of the skin and subcutaneous tissue, unspecified: Secondary | ICD-10-CM | POA: Diagnosis not present

## 2020-02-17 DIAGNOSIS — R69 Illness, unspecified: Secondary | ICD-10-CM | POA: Diagnosis not present

## 2020-02-17 DIAGNOSIS — I2584 Coronary atherosclerosis due to calcified coronary lesion: Secondary | ICD-10-CM | POA: Diagnosis not present

## 2020-02-17 DIAGNOSIS — E785 Hyperlipidemia, unspecified: Secondary | ICD-10-CM | POA: Diagnosis not present

## 2020-02-17 DIAGNOSIS — Z0000 Encounter for general adult medical examination without abnormal findings: Secondary | ICD-10-CM | POA: Diagnosis not present

## 2020-02-17 DIAGNOSIS — I493 Ventricular premature depolarization: Secondary | ICD-10-CM | POA: Diagnosis not present

## 2020-02-17 DIAGNOSIS — H3530 Unspecified macular degeneration: Secondary | ICD-10-CM | POA: Diagnosis not present

## 2020-03-01 DIAGNOSIS — R69 Illness, unspecified: Secondary | ICD-10-CM | POA: Diagnosis not present

## 2020-03-09 DIAGNOSIS — R69 Illness, unspecified: Secondary | ICD-10-CM | POA: Diagnosis not present

## 2020-03-15 ENCOUNTER — Encounter: Payer: Self-pay | Admitting: Gastroenterology

## 2020-03-17 DIAGNOSIS — Z20822 Contact with and (suspected) exposure to covid-19: Secondary | ICD-10-CM | POA: Diagnosis not present

## 2020-03-17 DIAGNOSIS — Z03818 Encounter for observation for suspected exposure to other biological agents ruled out: Secondary | ICD-10-CM | POA: Diagnosis not present

## 2020-03-28 DIAGNOSIS — Z1212 Encounter for screening for malignant neoplasm of rectum: Secondary | ICD-10-CM | POA: Diagnosis not present

## 2020-03-30 DIAGNOSIS — Z20822 Contact with and (suspected) exposure to covid-19: Secondary | ICD-10-CM | POA: Diagnosis not present

## 2020-04-21 ENCOUNTER — Encounter: Payer: Self-pay | Admitting: *Deleted

## 2020-05-09 ENCOUNTER — Other Ambulatory Visit: Payer: Self-pay

## 2020-05-09 ENCOUNTER — Ambulatory Visit (AMBULATORY_SURGERY_CENTER): Payer: Self-pay | Admitting: *Deleted

## 2020-05-09 VITALS — Ht 71.0 in | Wt 153.0 lb

## 2020-05-09 DIAGNOSIS — Z86010 Personal history of colonic polyps: Secondary | ICD-10-CM

## 2020-05-09 MED ORDER — PLENVU 140 G PO SOLR: 1.0000 | ORAL | 0 refills | Status: AC

## 2020-05-09 NOTE — Progress Notes (Signed)
No egg or soy allergy known to patient  No issues with past sedation with any surgeries or procedures No intubation problems in the past  No FH of Malignant Hyperthermia No diet pills per patient No home 02 use per patient  No blood thinners per patient  Pt denies issues with constipation  No A fib or A flutter  EMMI video to pt or via MyChart  COVID 19 guidelines implemented in [facility] today with Pt and RN  Pt is fully vaccinated  for Covid   Pt given the option in [facility] today for Golytely prep verses  alternative prep  ( Suprep/Plenvu)-  Pt is aware the Golytely has more volume but is more cost effective and the Suprep/Plenvu is less volume but may cost $60-150.  Pt voiced understanding of this and choose Plenvu Prep. Medicare Coupon for Plenvu to pt today in [place]   Due to the COVID-19 pandemic we are asking patients to follow certain guidelines.  Pt aware of COVID protocols and [facility] guidelines

## 2020-05-12 DIAGNOSIS — Z20822 Contact with and (suspected) exposure to covid-19: Secondary | ICD-10-CM | POA: Diagnosis not present

## 2020-05-17 ENCOUNTER — Encounter: Payer: Self-pay | Admitting: Gastroenterology

## 2020-05-23 ENCOUNTER — Encounter: Payer: Medicare HMO | Admitting: Gastroenterology

## 2020-05-25 ENCOUNTER — Other Ambulatory Visit: Payer: Self-pay

## 2020-05-25 ENCOUNTER — Ambulatory Visit (AMBULATORY_SURGERY_CENTER): Payer: Medicare HMO | Admitting: Gastroenterology

## 2020-05-25 ENCOUNTER — Encounter: Payer: Self-pay | Admitting: Gastroenterology

## 2020-05-25 VITALS — BP 136/86 | HR 83 | Temp 96.8°F | Resp 14 | Ht 71.0 in | Wt 153.0 lb

## 2020-05-25 DIAGNOSIS — Z860100 Personal history of colon polyps, unspecified: Secondary | ICD-10-CM

## 2020-05-25 DIAGNOSIS — D123 Benign neoplasm of transverse colon: Secondary | ICD-10-CM | POA: Diagnosis not present

## 2020-05-25 DIAGNOSIS — Z8616 Personal history of COVID-19: Secondary | ICD-10-CM | POA: Diagnosis not present

## 2020-05-25 DIAGNOSIS — D125 Benign neoplasm of sigmoid colon: Secondary | ICD-10-CM

## 2020-05-25 DIAGNOSIS — R69 Illness, unspecified: Secondary | ICD-10-CM | POA: Diagnosis not present

## 2020-05-25 MED ORDER — SODIUM CHLORIDE 0.9 % IV SOLN: 500.0000 mL | INTRAVENOUS | Status: DC

## 2020-05-25 NOTE — Patient Instructions (Signed)
Discharge instructions given. ?Handouts on polyps and Hemorrhoids. ?Resume previous medications. ?YOU HAD AN ENDOSCOPIC PROCEDURE TODAY AT THE [facility]:   Refer to the procedure report that was given to you for any specific questions about what was found during the examination.  If the procedure report does not answer your questions, please call your gastroenterologist to clarify.  If you requested that your care partner not be given the details of your procedure findings, then the procedure report has been included in a sealed envelope for you to review at your convenience later. ? ?YOU SHOULD EXPECT: Some feelings of bloating in the abdomen. Passage of more gas than usual.  Walking can help get rid of the air that was put into your GI tract during the procedure and reduce the bloating. If you had a lower endoscopy (such as a colonoscopy or flexible sigmoidoscopy) you may notice spotting of blood in your stool or on the toilet paper. If you underwent a bowel prep for your procedure, you may not have a normal bowel movement for a few days. ? ?Please Note:  You might notice some irritation and congestion in your nose or some drainage.  This is from the oxygen used during your procedure.  There is no need for concern and it should clear up in a day or so. ? ?SYMPTOMS TO REPORT IMMEDIATELY: ? ?Following lower endoscopy (colonoscopy or flexible sigmoidoscopy): ? Excessive amounts of blood in the stool ? Significant tenderness or worsening of abdominal pains ? Swelling of the abdomen that is new, acute ? Fever of 100?F or higher ? ? ?For urgent or emergent issues, a gastroenterologist can be reached at any hour by calling [phone]. ?Do not use MyChart messaging for urgent concerns.  ? ? ?DIET:  We do recommend a small meal at first, but then you may proceed to your regular diet.  Drink plenty of fluids but you should avoid alcoholic beverages for 24 hours. ? ?ACTIVITY:  You should plan to take it  easy for the rest of today and you should NOT DRIVE or use heavy machinery until tomorrow (because of the sedation medicines used during the test).   ? ?FOLLOW UP: ?Our staff will call the number listed on your records 48-72 hours following your procedure to check on you and address any questions or concerns that you may have regarding the information given to you following your procedure. If we do not reach you, we will leave a message.  We will attempt to reach you two times.  During this call, we will ask if you have developed any symptoms of COVID 19. If you develop any symptoms (ie: fever, flu-like symptoms, shortness of breath, cough etc.) before then, please call [phone].  If you test positive for Covid 19 in the 2 weeks post procedure, please call and report this information to us.   ? ?If any biopsies were taken you will be contacted by phone or by letter within the next 1-3 weeks.  Please call us at [phone] if you have not heard about the biopsies in 3 weeks.  ? ? ?SIGNATURES/CONFIDENTIALITY: ?You and/or your care partner have signed paperwork which will be entered into your electronic medical record.  These signatures attest to the fact that that the information above on your After Visit Summary has been reviewed and is understood.  Full responsibility of the confidentiality of this discharge information lies with you and/or your care-partner.  ?

## 2020-05-25 NOTE — Progress Notes (Signed)
Report to PACU, RN, vss, BBS= Clear.  

## 2020-05-25 NOTE — Progress Notes (Signed)
Called to room to assist during endoscopic procedure.  Patient ID and intended procedure confirmed with present staff. Received instructions for my participation in the procedure from the performing physician.  

## 2020-05-25 NOTE — Op Note (Signed)
[facility] Patient Name: John [name] Procedure Date: 05/25/2020 9:44 AM MRN: [id number] Endoscopist: Ladene Artist , MD Age: 75 Referring MD:  Date of Birth: [birth year] Gender: Male Account #: [id number] Procedure:                Colonoscopy Indications:              High risk colon cancer surveillance: Personal                            history of traditional serrated adenoma of the colon Medicines:                Monitored Anesthesia Care Procedure:                Pre-Anesthesia Assessment:                           - Prior to the procedure, a History and Physical                            was performed, and patient medications and                            allergies were reviewed. The patient's tolerance of                            previous anesthesia was also reviewed. The risks                            and benefits of the procedure and the sedation                            options and risks were discussed with the patient.                            All questions were answered, and informed consent                            was obtained. Prior Anticoagulants: The patient has                            taken no previous anticoagulant or antiplatelet                            agents. ASA Grade Assessment: II - A patient with                            mild systemic disease. After reviewing the risks                            and benefits, the patient was deemed in                            satisfactory condition to undergo the procedure.  After obtaining informed consent, the colonoscope                            was passed under direct vision. Throughout the                            procedure, the patient's blood pressure, pulse, and                            oxygen saturations were monitored continuously. The                            Olympus CF-HQ190L ([id number]) Colonoscope was                            introduced  through the anus and advanced to the the                            cecum, identified by appendiceal orifice and                            ileocecal valve. The ileocecal valve, appendiceal                            orifice, and rectum were photographed. The quality                            of the bowel preparation was excellent. The                            colonoscopy was performed without difficulty. The                            patient tolerated the procedure well. Scope In: 10:00:08 AM Scope Out: 10:17:16 AM Scope Withdrawal Time: 0 hours 13 minutes 17 seconds  Total Procedure Duration: 0 hours 17 minutes 8 seconds  Findings:                 The perianal and digital rectal examinations were                            normal.                           Two sessile polyps were found in the sigmoid colon                            and transverse colon. The polyps were 5 to 6 mm in                            size. These polyps were removed with a cold snare.                            Resection and retrieval were complete.  Five small and medium localized angiodysplastic                            lesions without bleeding were found in the                            transverse colon (3) and in the ascending colon (2).                           Internal hemorrhoids were found during                            retroflexion. The hemorrhoids were small and Grade                            I (internal hemorrhoids that do not prolapse).                           The exam was otherwise without abnormality on                            direct and retroflexion views. Complications:            No immediate complications. Estimated blood loss:                            None. Estimated Blood Loss:     Estimated blood loss: none. Impression:               - Two 5 to 6 mm polyps in the sigmoid colon and in                            the transverse colon, removed with a  cold snare.                            Resected and retrieved.                           - Five non-bleeding colonic angiodysplastic lesions.                           - Internal hemorrhoids.                           - The examination was otherwise normal on direct                            and retroflexion views. Recommendation:           - Repeat colonoscopy vs no repeat colonoscopy due                            to age after studies are complete for surveillance                            based on pathology results.                           -  Patient has a contact number available for                            emergencies. The signs and symptoms of potential                            delayed complications were discussed with the                            patient. Return to normal activities tomorrow.                            Written discharge instructions were provided to the                            patient.                           - Resume previous diet.                           - Continue present medications.                           - Await pathology results. Meryl Dare, MD 05/25/2020 10:21:47 AM This report has been signed electronically.

## 2020-05-29 ENCOUNTER — Telehealth: Payer: Self-pay | Admitting: *Deleted

## 2020-05-29 NOTE — Telephone Encounter (Signed)
  Follow up Call-  Call back number 05/25/2020  Post procedure Call Back phone  # [phone]  Permission to leave phone message Yes  Some recent data might be hidden     Patient questions:  Do you have a fever, pain , or abdominal swelling? No. Pain Score  0 *  Have you tolerated food without any problems? Yes.    Have you been able to return to your normal activities? Yes.    Do you have any questions about your discharge instructions: Diet   No. Medications  No. Follow up visit  Yes.    Do you have questions or concerns about your Care? No.  Actions: * If pain score is 4 or above: No action needed, pain <4.  1. Have you developed a fever since your procedure? no  2.   Have you had an respiratory symptoms (SOB or cough) since your procedure? no  3.   Have you tested positive for COVID 19 since your procedure no  4.   Have you had any family members/close contacts diagnosed with the COVID 19 since your procedure?  no   If yes to any of these questions please route to Joylene John, RN and Joella Prince, RN

## 2020-05-31 ENCOUNTER — Encounter: Payer: Self-pay | Admitting: Gastroenterology

## 2020-06-02 ENCOUNTER — Telehealth: Payer: Self-pay | Admitting: Gastroenterology

## 2020-06-02 NOTE — Telephone Encounter (Signed)
Dr. Fuller Plan, you did not indicate a recall.  Is this correct?

## 2020-06-02 NOTE — Telephone Encounter (Signed)
Patient notified of the recommendations.  He is very pleased this was maybe his last colonoscopy.

## 2020-06-02 NOTE — Telephone Encounter (Signed)
No recall was the recommendation.  With 1 small precancerous polyp on his colonoscopy the current guidelines recommend the next colonoscopy in 7-10 years, at ages 48-82. The current guidelines recommend discontinuing colonoscopies for routine screening, surveillance at age 73.  Of course these are only guidelines and if patients are interested we can continue screening, surveillance colonoscopies until early 80s if they are in good health. If he would like we can set a 7 year recall, 05/2027, or leave it as no recall. Either option is reasonable.

## 2020-06-02 NOTE — Telephone Encounter (Signed)
Patient is asking when his next recall would be please advise

## 2020-09-06 DIAGNOSIS — H353131 Nonexudative age-related macular degeneration, bilateral, early dry stage: Secondary | ICD-10-CM | POA: Diagnosis not present

## 2020-09-06 DIAGNOSIS — Z961 Presence of intraocular lens: Secondary | ICD-10-CM | POA: Diagnosis not present

## 2020-09-06 DIAGNOSIS — H43813 Vitreous degeneration, bilateral: Secondary | ICD-10-CM | POA: Diagnosis not present

## 2020-09-06 DIAGNOSIS — H31002 Unspecified chorioretinal scars, left eye: Secondary | ICD-10-CM | POA: Diagnosis not present

## 2020-12-08 DIAGNOSIS — Z20822 Contact with and (suspected) exposure to covid-19: Secondary | ICD-10-CM | POA: Diagnosis not present

## 2020-12-08 DIAGNOSIS — Z03818 Encounter for observation for suspected exposure to other biological agents ruled out: Secondary | ICD-10-CM | POA: Diagnosis not present

## 2021-01-30 DIAGNOSIS — E785 Hyperlipidemia, unspecified: Secondary | ICD-10-CM | POA: Diagnosis not present

## 2021-02-03 DIAGNOSIS — Z23 Encounter for immunization: Secondary | ICD-10-CM | POA: Diagnosis not present

## 2021-02-05 DIAGNOSIS — J3489 Other specified disorders of nose and nasal sinuses: Secondary | ICD-10-CM | POA: Diagnosis not present

## 2021-02-05 DIAGNOSIS — R040 Epistaxis: Secondary | ICD-10-CM | POA: Diagnosis not present

## 2021-02-06 ENCOUNTER — Encounter (INDEPENDENT_AMBULATORY_CARE_PROVIDER_SITE_OTHER): Payer: Medicare HMO | Admitting: Ophthalmology

## 2021-02-06 DIAGNOSIS — R040 Epistaxis: Secondary | ICD-10-CM | POA: Diagnosis not present

## 2021-02-06 DIAGNOSIS — J3489 Other specified disorders of nose and nasal sinuses: Secondary | ICD-10-CM | POA: Diagnosis not present

## 2021-02-08 DIAGNOSIS — R040 Epistaxis: Secondary | ICD-10-CM | POA: Diagnosis not present

## 2021-02-08 DIAGNOSIS — J3489 Other specified disorders of nose and nasal sinuses: Secondary | ICD-10-CM | POA: Diagnosis not present

## 2021-02-08 DIAGNOSIS — R930 Abnormal findings on diagnostic imaging of skull and head, not elsewhere classified: Secondary | ICD-10-CM | POA: Diagnosis not present

## 2021-02-12 DIAGNOSIS — D491 Neoplasm of unspecified behavior of respiratory system: Secondary | ICD-10-CM | POA: Diagnosis not present

## 2021-02-14 DIAGNOSIS — J3489 Other specified disorders of nose and nasal sinuses: Secondary | ICD-10-CM | POA: Diagnosis not present

## 2021-02-14 DIAGNOSIS — R040 Epistaxis: Secondary | ICD-10-CM | POA: Diagnosis not present

## 2021-02-20 DIAGNOSIS — J3489 Other specified disorders of nose and nasal sinuses: Secondary | ICD-10-CM | POA: Diagnosis not present

## 2021-02-20 DIAGNOSIS — R040 Epistaxis: Secondary | ICD-10-CM | POA: Diagnosis not present

## 2021-02-20 DIAGNOSIS — J320 Chronic maxillary sinusitis: Secondary | ICD-10-CM | POA: Diagnosis not present

## 2021-02-26 DIAGNOSIS — Z125 Encounter for screening for malignant neoplasm of prostate: Secondary | ICD-10-CM | POA: Diagnosis not present

## 2021-02-26 DIAGNOSIS — E785 Hyperlipidemia, unspecified: Secondary | ICD-10-CM | POA: Diagnosis not present

## 2021-02-26 DIAGNOSIS — R030 Elevated blood-pressure reading, without diagnosis of hypertension: Secondary | ICD-10-CM | POA: Diagnosis not present

## 2021-02-28 DIAGNOSIS — J3489 Other specified disorders of nose and nasal sinuses: Secondary | ICD-10-CM | POA: Diagnosis not present

## 2021-03-02 DIAGNOSIS — I2584 Coronary atherosclerosis due to calcified coronary lesion: Secondary | ICD-10-CM | POA: Diagnosis not present

## 2021-03-02 DIAGNOSIS — M6530 Trigger finger, unspecified finger: Secondary | ICD-10-CM | POA: Diagnosis not present

## 2021-03-02 DIAGNOSIS — R82998 Other abnormal findings in urine: Secondary | ICD-10-CM | POA: Diagnosis not present

## 2021-03-02 DIAGNOSIS — R69 Illness, unspecified: Secondary | ICD-10-CM | POA: Diagnosis not present

## 2021-03-02 DIAGNOSIS — R030 Elevated blood-pressure reading, without diagnosis of hypertension: Secondary | ICD-10-CM | POA: Diagnosis not present

## 2021-03-02 DIAGNOSIS — Z1331 Encounter for screening for depression: Secondary | ICD-10-CM | POA: Diagnosis not present

## 2021-03-02 DIAGNOSIS — Z0000 Encounter for general adult medical examination without abnormal findings: Secondary | ICD-10-CM | POA: Diagnosis not present

## 2021-03-02 DIAGNOSIS — Z1212 Encounter for screening for malignant neoplasm of rectum: Secondary | ICD-10-CM | POA: Diagnosis not present

## 2021-03-02 DIAGNOSIS — Z1389 Encounter for screening for other disorder: Secondary | ICD-10-CM | POA: Diagnosis not present

## 2021-03-02 DIAGNOSIS — L821 Other seborrheic keratosis: Secondary | ICD-10-CM | POA: Diagnosis not present

## 2021-03-02 DIAGNOSIS — E785 Hyperlipidemia, unspecified: Secondary | ICD-10-CM | POA: Diagnosis not present

## 2021-03-02 DIAGNOSIS — L918 Other hypertrophic disorders of the skin: Secondary | ICD-10-CM | POA: Diagnosis not present

## 2021-03-02 DIAGNOSIS — F325 Major depressive disorder, single episode, in full remission: Secondary | ICD-10-CM | POA: Diagnosis not present

## 2021-03-28 DIAGNOSIS — J330 Polyp of nasal cavity: Secondary | ICD-10-CM | POA: Diagnosis not present

## 2021-03-28 DIAGNOSIS — J3489 Other specified disorders of nose and nasal sinuses: Secondary | ICD-10-CM | POA: Diagnosis not present

## 2021-04-02 DIAGNOSIS — H26493 Other secondary cataract, bilateral: Secondary | ICD-10-CM | POA: Diagnosis not present

## 2021-04-02 DIAGNOSIS — H35363 Drusen (degenerative) of macula, bilateral: Secondary | ICD-10-CM | POA: Diagnosis not present

## 2021-04-02 DIAGNOSIS — D3132 Benign neoplasm of left choroid: Secondary | ICD-10-CM | POA: Diagnosis not present

## 2021-05-15 DIAGNOSIS — S0501XA Injury of conjunctiva and corneal abrasion without foreign body, right eye, initial encounter: Secondary | ICD-10-CM | POA: Diagnosis not present

## 2021-05-15 DIAGNOSIS — X58XXXA Exposure to other specified factors, initial encounter: Secondary | ICD-10-CM | POA: Diagnosis not present

## 2021-05-22 DIAGNOSIS — M2011 Hallux valgus (acquired), right foot: Secondary | ICD-10-CM | POA: Diagnosis not present

## 2021-05-22 DIAGNOSIS — M205X1 Other deformities of toe(s) (acquired), right foot: Secondary | ICD-10-CM | POA: Diagnosis not present

## 2021-05-31 DIAGNOSIS — H5213 Myopia, bilateral: Secondary | ICD-10-CM | POA: Diagnosis not present

## 2021-05-31 DIAGNOSIS — H35413 Lattice degeneration of retina, bilateral: Secondary | ICD-10-CM | POA: Diagnosis not present

## 2021-05-31 DIAGNOSIS — D3132 Benign neoplasm of left choroid: Secondary | ICD-10-CM | POA: Diagnosis not present

## 2021-05-31 DIAGNOSIS — H43813 Vitreous degeneration, bilateral: Secondary | ICD-10-CM | POA: Diagnosis not present

## 2021-05-31 DIAGNOSIS — H33322 Round hole, left eye: Secondary | ICD-10-CM | POA: Diagnosis not present

## 2021-05-31 DIAGNOSIS — H15833 Staphyloma posticum, bilateral: Secondary | ICD-10-CM | POA: Diagnosis not present

## 2021-05-31 DIAGNOSIS — H35363 Drusen (degenerative) of macula, bilateral: Secondary | ICD-10-CM | POA: Diagnosis not present

## 2021-07-20 DIAGNOSIS — Z0000 Encounter for general adult medical examination without abnormal findings: Secondary | ICD-10-CM | POA: Diagnosis not present

## 2021-07-20 DIAGNOSIS — I480 Paroxysmal atrial fibrillation: Secondary | ICD-10-CM | POA: Diagnosis not present

## 2021-07-20 DIAGNOSIS — I10 Essential (primary) hypertension: Secondary | ICD-10-CM | POA: Diagnosis not present

## 2021-07-20 DIAGNOSIS — E785 Hyperlipidemia, unspecified: Secondary | ICD-10-CM | POA: Diagnosis not present

## 2021-07-20 DIAGNOSIS — Z125 Encounter for screening for malignant neoplasm of prostate: Secondary | ICD-10-CM | POA: Diagnosis not present

## 2021-07-20 DIAGNOSIS — Z86010 Personal history of colonic polyps: Secondary | ICD-10-CM | POA: Diagnosis not present

## 2021-07-20 DIAGNOSIS — R9431 Abnormal electrocardiogram [ECG] [EKG]: Secondary | ICD-10-CM | POA: Diagnosis not present

## 2021-07-20 DIAGNOSIS — E782 Mixed hyperlipidemia: Secondary | ICD-10-CM | POA: Diagnosis not present

## 2021-07-31 DIAGNOSIS — I480 Paroxysmal atrial fibrillation: Secondary | ICD-10-CM | POA: Diagnosis not present

## 2021-07-31 DIAGNOSIS — I10 Essential (primary) hypertension: Secondary | ICD-10-CM | POA: Diagnosis not present

## 2021-08-14 DIAGNOSIS — I480 Paroxysmal atrial fibrillation: Secondary | ICD-10-CM | POA: Diagnosis not present

## 2021-08-14 DIAGNOSIS — R002 Palpitations: Secondary | ICD-10-CM | POA: Diagnosis not present

## 2021-08-20 DIAGNOSIS — I493 Ventricular premature depolarization: Secondary | ICD-10-CM | POA: Diagnosis not present

## 2021-09-10 DIAGNOSIS — I493 Ventricular premature depolarization: Secondary | ICD-10-CM | POA: Diagnosis not present

## 2021-09-12 DIAGNOSIS — I10 Essential (primary) hypertension: Secondary | ICD-10-CM | POA: Diagnosis not present

## 2021-09-12 DIAGNOSIS — R69 Illness, unspecified: Secondary | ICD-10-CM | POA: Diagnosis not present

## 2021-09-12 DIAGNOSIS — Z23 Encounter for immunization: Secondary | ICD-10-CM | POA: Diagnosis not present

## 2021-11-07 DIAGNOSIS — H26492 Other secondary cataract, left eye: Secondary | ICD-10-CM | POA: Diagnosis not present

## 2021-12-19 DIAGNOSIS — I10 Essential (primary) hypertension: Secondary | ICD-10-CM | POA: Diagnosis not present

## 2021-12-19 DIAGNOSIS — G4700 Insomnia, unspecified: Secondary | ICD-10-CM | POA: Diagnosis not present

## 2022-01-01 DIAGNOSIS — D3132 Benign neoplasm of left choroid: Secondary | ICD-10-CM | POA: Diagnosis not present

## 2022-01-01 DIAGNOSIS — H35363 Drusen (degenerative) of macula, bilateral: Secondary | ICD-10-CM | POA: Diagnosis not present

## 2022-01-01 DIAGNOSIS — H26492 Other secondary cataract, left eye: Secondary | ICD-10-CM | POA: Diagnosis not present

## 2022-01-01 DIAGNOSIS — H5213 Myopia, bilateral: Secondary | ICD-10-CM | POA: Diagnosis not present

## 2022-01-01 DIAGNOSIS — H15833 Staphyloma posticum, bilateral: Secondary | ICD-10-CM | POA: Diagnosis not present

## 2022-01-01 DIAGNOSIS — H43813 Vitreous degeneration, bilateral: Secondary | ICD-10-CM | POA: Diagnosis not present

## 2022-01-30 DIAGNOSIS — J3489 Other specified disorders of nose and nasal sinuses: Secondary | ICD-10-CM | POA: Diagnosis not present

## 2022-01-30 DIAGNOSIS — R040 Epistaxis: Secondary | ICD-10-CM | POA: Diagnosis not present

## 2022-02-06 DIAGNOSIS — M85642 Other cyst of bone, left hand: Secondary | ICD-10-CM | POA: Diagnosis not present

## 2022-03-04 DIAGNOSIS — R2232 Localized swelling, mass and lump, left upper limb: Secondary | ICD-10-CM | POA: Diagnosis not present

## 2022-03-04 DIAGNOSIS — M7989 Other specified soft tissue disorders: Secondary | ICD-10-CM | POA: Diagnosis not present

## 2022-04-05 DIAGNOSIS — H6502 Acute serous otitis media, left ear: Secondary | ICD-10-CM | POA: Diagnosis not present

## 2022-04-08 DIAGNOSIS — H26493 Other secondary cataract, bilateral: Secondary | ICD-10-CM | POA: Diagnosis not present

## 2022-06-20 DIAGNOSIS — R69 Illness, unspecified: Secondary | ICD-10-CM | POA: Diagnosis not present

## 2022-06-24 DIAGNOSIS — H35363 Drusen (degenerative) of macula, bilateral: Secondary | ICD-10-CM | POA: Diagnosis not present

## 2022-06-24 DIAGNOSIS — H43813 Vitreous degeneration, bilateral: Secondary | ICD-10-CM | POA: Diagnosis not present

## 2022-06-24 DIAGNOSIS — H33322 Round hole, left eye: Secondary | ICD-10-CM | POA: Diagnosis not present

## 2022-06-24 DIAGNOSIS — H35413 Lattice degeneration of retina, bilateral: Secondary | ICD-10-CM | POA: Diagnosis not present

## 2022-06-24 DIAGNOSIS — D3132 Benign neoplasm of left choroid: Secondary | ICD-10-CM | POA: Diagnosis not present

## 2022-08-05 DIAGNOSIS — Z0000 Encounter for general adult medical examination without abnormal findings: Secondary | ICD-10-CM | POA: Diagnosis not present

## 2022-08-05 DIAGNOSIS — E782 Mixed hyperlipidemia: Secondary | ICD-10-CM | POA: Diagnosis not present

## 2022-08-05 DIAGNOSIS — K922 Gastrointestinal hemorrhage, unspecified: Secondary | ICD-10-CM | POA: Diagnosis not present

## 2022-08-05 DIAGNOSIS — Z789 Other specified health status: Secondary | ICD-10-CM | POA: Diagnosis not present

## 2022-08-05 DIAGNOSIS — I10 Essential (primary) hypertension: Secondary | ICD-10-CM | POA: Diagnosis not present

## 2022-08-05 DIAGNOSIS — Z125 Encounter for screening for malignant neoplasm of prostate: Secondary | ICD-10-CM | POA: Diagnosis not present

## 2022-08-05 DIAGNOSIS — Z1159 Encounter for screening for other viral diseases: Secondary | ICD-10-CM | POA: Diagnosis not present

## 2022-08-05 DIAGNOSIS — Z1330 Encounter for screening examination for mental health and behavioral disorders, unspecified: Secondary | ICD-10-CM | POA: Diagnosis not present

## 2022-08-05 DIAGNOSIS — Z1331 Encounter for screening for depression: Secondary | ICD-10-CM | POA: Diagnosis not present

## 2022-08-20 DIAGNOSIS — R002 Palpitations: Secondary | ICD-10-CM | POA: Diagnosis not present

## 2022-09-03 DIAGNOSIS — D225 Melanocytic nevi of trunk: Secondary | ICD-10-CM | POA: Diagnosis not present

## 2022-09-03 DIAGNOSIS — L570 Actinic keratosis: Secondary | ICD-10-CM | POA: Diagnosis not present

## 2022-09-03 DIAGNOSIS — L218 Other seborrheic dermatitis: Secondary | ICD-10-CM | POA: Diagnosis not present

## 2022-09-03 DIAGNOSIS — X32XXXA Exposure to sunlight, initial encounter: Secondary | ICD-10-CM | POA: Diagnosis not present

## 2022-09-09 DIAGNOSIS — L918 Other hypertrophic disorders of the skin: Secondary | ICD-10-CM | POA: Diagnosis not present

## 2022-09-09 DIAGNOSIS — E785 Hyperlipidemia, unspecified: Secondary | ICD-10-CM | POA: Diagnosis not present

## 2022-09-09 DIAGNOSIS — L821 Other seborrheic keratosis: Secondary | ICD-10-CM | POA: Diagnosis not present

## 2022-09-09 DIAGNOSIS — F325 Major depressive disorder, single episode, in full remission: Secondary | ICD-10-CM | POA: Diagnosis not present

## 2022-09-09 DIAGNOSIS — R030 Elevated blood-pressure reading, without diagnosis of hypertension: Secondary | ICD-10-CM | POA: Diagnosis not present

## 2022-09-09 DIAGNOSIS — M6530 Trigger finger, unspecified finger: Secondary | ICD-10-CM | POA: Diagnosis not present

## 2022-09-09 DIAGNOSIS — I2584 Coronary atherosclerosis due to calcified coronary lesion: Secondary | ICD-10-CM | POA: Diagnosis not present

## 2022-09-09 DIAGNOSIS — M5430 Sciatica, unspecified side: Secondary | ICD-10-CM | POA: Diagnosis not present

## 2022-09-09 DIAGNOSIS — J309 Allergic rhinitis, unspecified: Secondary | ICD-10-CM | POA: Diagnosis not present

## 2022-09-09 DIAGNOSIS — R040 Epistaxis: Secondary | ICD-10-CM | POA: Diagnosis not present

## 2022-09-09 DIAGNOSIS — L729 Follicular cyst of the skin and subcutaneous tissue, unspecified: Secondary | ICD-10-CM | POA: Diagnosis not present

## 2022-09-09 DIAGNOSIS — F1099 Alcohol use, unspecified with unspecified alcohol-induced disorder: Secondary | ICD-10-CM | POA: Diagnosis not present

## 2023-01-18 DIAGNOSIS — N390 Urinary tract infection, site not specified: Secondary | ICD-10-CM | POA: Diagnosis not present
